# Patient Record
Sex: Male | Born: 1970 | Hispanic: No | Marital: Married | Smoking: Former smoker
Health system: Southern US, Community
[De-identification: ages and names within clinical notes are randomized; demographics above are authoritative.]

## PROBLEM LIST (undated history)

## (undated) DIAGNOSIS — E119 Type 2 diabetes mellitus without complications: Secondary | ICD-10-CM

## (undated) DIAGNOSIS — I10 Essential (primary) hypertension: Secondary | ICD-10-CM

## (undated) DIAGNOSIS — K760 Fatty (change of) liver, not elsewhere classified: Secondary | ICD-10-CM

## (undated) DIAGNOSIS — E785 Hyperlipidemia, unspecified: Secondary | ICD-10-CM

## (undated) DIAGNOSIS — N281 Cyst of kidney, acquired: Secondary | ICD-10-CM

## (undated) DIAGNOSIS — G4730 Sleep apnea, unspecified: Secondary | ICD-10-CM

## (undated) HISTORY — DX: Hyperlipidemia, unspecified: E78.5

## (undated) HISTORY — PX: NO PAST SURGERIES: SHX2092

## (undated) HISTORY — DX: Type 2 diabetes mellitus without complications: E11.9

## (undated) HISTORY — DX: Essential (primary) hypertension: I10

## (undated) HISTORY — DX: Fatty (change of) liver, not elsewhere classified: K76.0

## (undated) HISTORY — DX: Sleep apnea, unspecified: G47.30

---

## 1998-03-23 ENCOUNTER — Encounter: Admission: RE | Admit: 1998-03-23 | Discharge: 1998-03-23 | Payer: Self-pay | Admitting: *Deleted

## 1998-03-27 ENCOUNTER — Emergency Department (HOSPITAL_COMMUNITY): Admission: EM | Admit: 1998-03-27 | Discharge: 1998-03-27 | Payer: Self-pay | Admitting: Emergency Medicine

## 1998-04-03 ENCOUNTER — Encounter: Admission: RE | Admit: 1998-04-03 | Discharge: 1998-04-03 | Payer: Self-pay | Admitting: *Deleted

## 2008-06-23 HISTORY — PX: OTHER SURGICAL HISTORY: SHX169

## 2008-07-04 ENCOUNTER — Encounter: Admission: RE | Admit: 2008-07-04 | Discharge: 2008-07-04 | Payer: Self-pay | Admitting: Internal Medicine

## 2010-05-06 LAB — COMPREHENSIVE METABOLIC PANEL
AST: 42 U/L
Albumin: 5.1
Alkaline Phosphatase: 77 U/L
Sodium: 141 mmol/L (ref 137–147)
Total Protein: 7.6 g/dL

## 2010-05-06 LAB — LIPID PANEL: Triglycerides: 113

## 2010-05-06 LAB — TSH: TSH: 2.52

## 2010-05-06 LAB — CBC
MCV: 81.4 fL
platelet count: 184

## 2010-05-23 DIAGNOSIS — G4730 Sleep apnea, unspecified: Secondary | ICD-10-CM

## 2010-05-23 HISTORY — DX: Sleep apnea, unspecified: G47.30

## 2010-06-14 ENCOUNTER — Encounter: Payer: Self-pay | Admitting: Internal Medicine

## 2011-03-11 ENCOUNTER — Ambulatory Visit: Payer: Self-pay | Admitting: Family Medicine

## 2011-03-28 ENCOUNTER — Encounter: Payer: Self-pay | Admitting: *Deleted

## 2011-03-28 ENCOUNTER — Encounter: Payer: Self-pay | Admitting: Family Medicine

## 2011-03-28 ENCOUNTER — Ambulatory Visit (INDEPENDENT_AMBULATORY_CARE_PROVIDER_SITE_OTHER): Payer: Self-pay | Admitting: Family Medicine

## 2011-03-28 VITALS — BP 128/78 | HR 76 | Temp 98.3°F | Ht 70.0 in | Wt 216.4 lb

## 2011-03-28 DIAGNOSIS — J329 Chronic sinusitis, unspecified: Secondary | ICD-10-CM

## 2011-03-28 DIAGNOSIS — Z0000 Encounter for general adult medical examination without abnormal findings: Secondary | ICD-10-CM

## 2011-03-28 LAB — COMPREHENSIVE METABOLIC PANEL
ALT: 48 U/L (ref 0–53)
Alkaline Phosphatase: 77 U/L (ref 39–117)
Creatinine, Ser: 1.3 mg/dL (ref 0.4–1.5)
Sodium: 141 mEq/L (ref 135–145)
Total Bilirubin: 1.8 mg/dL — ABNORMAL HIGH (ref 0.3–1.2)
Total Protein: 8.1 g/dL (ref 6.0–8.3)

## 2011-03-28 LAB — LIPID PANEL
HDL: 45.4 mg/dL (ref >39.00)
Total CHOL/HDL Ratio: 5
Triglycerides: 142 mg/dL (ref 0.0–149.0)
VLDL: 28.4 mg/dL (ref 0.0–40.0)

## 2011-03-28 MED ORDER — KETOROLAC TROMETHAMINE 60 MG/2ML IM SOLN
60.0000 mg | Freq: Once | INTRAMUSCULAR | Status: AC
  Administered 2011-03-28: 60 mg via INTRAMUSCULAR

## 2011-03-28 MED ORDER — AMOXICILLIN-POT CLAVULANATE 875-125 MG PO TABS: 1.0000 | ORAL_TABLET | Freq: Two times a day (BID) | ORAL | Status: AC

## 2011-03-28 NOTE — Patient Instructions (Addendum)
blood work today. We will defer flu shot. Shot of toradol for pain today.  Then starting tomorrow may take ibuprofen 400-600mg  2-3x/day. I do think you have a sinus infection. Take medicine as prescribed: augmentin twice daily for 10 days. Push fluids and plenty of rest. Nasal saline irrigation or neti pot to help drain sinuses. May use simple mucinex with plenty of fluid to help mobilize mucous. Let us know if fever >101.5, trouble opening/closing mouth, difficulty swallowing, or worsening cough, breathing - you may need to be seen again.

## 2011-03-28 NOTE — Progress Notes (Signed)
Addended by: Josph Macho A on: 03/28/2011 10:30 AM   Modules accepted: Orders

## 2011-03-28 NOTE — Assessment & Plan Note (Signed)
R sinus headache with sinusitis by story. Given presentation, treat with augmentin x 10 days. rec nasal saline, ibuprofen, and mucinex at home. Red flags to return discussed including worsening headache or changing symptoms. Treat headache today with shot of toradol IM.  Denies h/o kidney insufficiency

## 2011-03-28 NOTE — Assessment & Plan Note (Signed)
reviewed preventative protocols and updated. Hold flu as feeling ill. Per pt utd tetanus 2010, will request records from prior PCP. Fasting blood work today.

## 2011-03-28 NOTE — Progress Notes (Signed)
Subjective:    Patient ID: Michael Mcconnell, male    DOB: October 24, 1970, 40 y.o.   MRN: [id number]  HPI CC: new pt establish  would like CPE today.  Woke up yesterday with sinus congestion, bad headache.  + ST.  Blowing nose but nothing coming out, very congested and ears feel stopped up.  Headache travels down to right side of neck - pressure, throbbing.  Mild cough.  Nauseated this morning.  Headache present since last night.  Worse when laying down, pressure in sinuses.  Mild photo/phonophobia.  Hasn't tried anything for this headache.  + tooth pain on right.  Worse with bending head forward.  No fevers/chills, abd pain, v/d, rashes, myalgia, arthralgia.  No vision changes, dizziness.  No paresthesias.    H/o seasonal allergies. H/o OSA, uses CPAP intermittently. No h/o kidney problems.  Preventative: Tetanus at [place] at [facility] 2010. No flu shot this year yet. Last CPE was 1 year ago at [facility] (Dr. Estevan Oaks). Seatbelt 100% time. Sunscreen use - no.  Medications and allergies reviewed and updated in chart.  Past histories reviewed and updated if relevant as below. There is no problem list on file for this patient.  Past Medical History  Diagnosis Date  . Elevated blood pressure reading without diagnosis of hypertension    Past Surgical History  Procedure Date  . No past surgeries    History  Substance Use Topics  . Smoking status: Former Smoker    Quit date: 05/23/2006  . Smokeless tobacco: Never Used   Comment: Socially  . Alcohol Use: Yes     Social   Family History  Problem Relation Age of Onset  . Hypertension Mother   . Coronary artery disease Father 55  . Hypertension Father   . Hyperlipidemia Father   . Stroke Maternal Aunt   . Diabetes Maternal Grandfather   . Stroke Maternal Grandfather   . Diabetes Paternal Grandmother   . Stroke Paternal Grandmother   . Cancer Neg Hx    Allergies  Allergen Reactions  . Aspirin Nausea Only   No current  outpatient prescriptions on file prior to visit.   Review of Systems  Constitutional: Negative for fever, chills, activity change, appetite change, fatigue and unexpected weight change.  HENT: Negative for hearing loss and neck pain.   Eyes: Negative for visual disturbance.  Respiratory: Positive for cough. Negative for chest tightness, shortness of breath and wheezing.   Cardiovascular: Negative for chest pain, palpitations and leg swelling.  Gastrointestinal: Negative for nausea, vomiting, abdominal pain, diarrhea, constipation, blood in stool and abdominal distention.  Genitourinary: Negative for hematuria and difficulty urinating.  Musculoskeletal: Negative for myalgias and arthralgias.  Skin: Negative for rash.  Neurological: Positive for headaches. Negative for dizziness, seizures and syncope.  Hematological: Does not bruise/bleed easily.  Psychiatric/Behavioral: Negative for dysphoric mood. The patient is not nervous/anxious.        Objective:   Physical Exam  Nursing note and vitals reviewed. Constitutional: He is oriented to person, place, and time. He appears well-developed and well-nourished. No distress.  HENT:  Head: Normocephalic and atraumatic.  Right Ear: Hearing, tympanic membrane, external ear and ear canal normal.  Left Ear: Hearing, tympanic membrane, external ear and ear canal normal.  Nose: Mucosal edema present. No rhinorrhea. Right sinus exhibits no maxillary sinus tenderness and no frontal sinus tenderness. Left sinus exhibits no maxillary sinus tenderness and no frontal sinus tenderness.  Mouth/Throat: Uvula is midline and mucous membranes are normal. Posterior oropharyngeal  edema and posterior oropharyngeal erythema present. No oropharyngeal exudate or tonsillar abscesses.  Eyes: Conjunctivae and EOM are normal. Pupils are equal, round, and reactive to light. No scleral icterus.  Neck: Normal range of motion. Neck supple. No thyromegaly present.    Cardiovascular: Normal rate, regular rhythm, normal heart sounds and intact distal pulses.   No murmur heard. Pulses:      Radial pulses are 2+ on the right side, and 2+ on the left side.  Pulmonary/Chest: Effort normal and breath sounds normal. No respiratory distress. He has no wheezes. He has no rales.  Abdominal: Soft. Bowel sounds are normal. He exhibits no distension and no mass. There is no tenderness. There is no rebound and no guarding.  Musculoskeletal: Normal range of motion.       FROM neck. No midline spine tenderness, no shoulder or trap muscle tenderness  Lymphadenopathy:    He has no cervical adenopathy.  Neurological: He is alert and oriented to person, place, and time. He has normal strength. No cranial nerve deficit or sensory deficit. Coordination and gait normal.       Grip strength intact  Skin: Skin is warm and dry. No rash noted.  Psychiatric: He has a normal mood and affect. His behavior is normal. Judgment and thought content normal.      Assessment & Plan:

## 2011-05-01 ENCOUNTER — Encounter: Payer: Self-pay | Admitting: Family Medicine

## 2011-05-04 ENCOUNTER — Encounter: Payer: Self-pay | Admitting: Family Medicine

## 2011-06-15 ENCOUNTER — Telehealth: Payer: Self-pay | Admitting: Family Medicine

## 2011-06-15 DIAGNOSIS — R739 Hyperglycemia, unspecified: Secondary | ICD-10-CM

## 2011-06-15 NOTE — Telephone Encounter (Signed)
Pt has a physical scheduled. Need order for physical  labs mailed to pts home. Labs are done free for [facility] employees.

## 2011-06-16 DIAGNOSIS — E1169 Type 2 diabetes mellitus with other specified complication: Secondary | ICD-10-CM | POA: Insufficient documentation

## 2011-06-16 DIAGNOSIS — E1165 Type 2 diabetes mellitus with hyperglycemia: Secondary | ICD-10-CM | POA: Insufficient documentation

## 2011-06-16 NOTE — Telephone Encounter (Signed)
Wrote script for labs:  BMP, A1c indication hyperglycemia. Placed in [facility]im[facility] box.

## 2011-06-16 NOTE — Telephone Encounter (Signed)
Mailed script as directed.

## 2011-06-20 ENCOUNTER — Other Ambulatory Visit: Payer: Self-pay

## 2011-06-27 ENCOUNTER — Encounter: Payer: Self-pay | Admitting: Family Medicine

## 2011-06-27 ENCOUNTER — Ambulatory Visit (INDEPENDENT_AMBULATORY_CARE_PROVIDER_SITE_OTHER): Payer: BC Managed Care – PPO | Admitting: Family Medicine

## 2011-06-27 ENCOUNTER — Ambulatory Visit: Payer: Self-pay | Admitting: Family Medicine

## 2011-06-27 VITALS — BP 146/102 | HR 80 | Temp 99.3°F | Wt 218.2 lb

## 2011-06-27 DIAGNOSIS — I10 Essential (primary) hypertension: Secondary | ICD-10-CM

## 2011-06-27 DIAGNOSIS — E785 Hyperlipidemia, unspecified: Secondary | ICD-10-CM | POA: Insufficient documentation

## 2011-06-27 DIAGNOSIS — R7309 Other abnormal glucose: Secondary | ICD-10-CM

## 2011-06-27 DIAGNOSIS — E1169 Type 2 diabetes mellitus with other specified complication: Secondary | ICD-10-CM | POA: Insufficient documentation

## 2011-06-27 LAB — BASIC METABOLIC PANEL
BUN: 13 mg/dL (ref 4–21)
Chloride: 105 mmol/L
Creat: 1.15
Potassium: 4.4 mmol/L

## 2011-06-27 MED ORDER — HYDROCHLOROTHIAZIDE 12.5 MG PO TABS: 12.5000 mg | ORAL_TABLET | Freq: Every day | ORAL | Status: DC

## 2011-06-27 NOTE — Assessment & Plan Note (Signed)
Discussed mild hyperlipidemia on latest blood work. Goal LDL likely <100 given fm hx  Discussed dietary modifications to lower LDL.

## 2011-06-27 NOTE — Patient Instructions (Addendum)
Restart hydrochlorothiazide at 12.5mg  daily as your blood pressure is staying elevated. Keep track of blood pressure at home. Work on diet and exercise as weight loss will help both blood pressure and sugar. We will call you with results of today's blood work. Return in 2 months for follow up.  Good to see you today. For cholesterol - More soy, nuts, beans, barley, lentils, oats and plant sterol ester enriched margarine instead of butter.

## 2011-06-27 NOTE — Assessment & Plan Note (Signed)
Glu elevated last visit - rechecked today but no results available yet. Will call him with results.

## 2011-06-27 NOTE — Assessment & Plan Note (Addendum)
Elevated today, and on recheck.  H/o HTN but prior visit good. Restart low dose HCTZ, return 2 mo for f/u. Advised to keep eye on bp at home (has cuff). Baseline EKG today - NSR rate 98, normal axis, intervals, no hypertrophy or acute ST/T changes.

## 2011-06-27 NOTE — Progress Notes (Signed)
  Subjective:    Patient ID: Michael Mcconnell, male    DOB: 1971/01/24, 41 y.o.   MRN: [id number]  HPI CC: review blood work  Pleasant 41 yo with h/o NAFLD and OSA on CPAP presents for blood work f/u but only had labs drawn at [facility] this morning - no results available yet.  I will call him with results.  fmhx CAD premature (father).  HTN - prior on meds, off for last 7 months.  Prior on HCTZ.  No HA, vision changes, CP/tightness, SOB, leg swelling.  Medications and allergies reviewed and updated in chart.  Past histories reviewed and updated if relevant as below. Patient Active Problem List  Diagnoses  . Healthcare maintenance  . Sinusitis  . Hyperglycemia   Past Medical History  Diagnosis Date  . Sleep apnea 2012    CPAP 4 hours/night  . NAFLD (nonalcoholic fatty liver disease) 4098    abd [place], viral hep panel neg  . History of hypertension     off meds  . History of hyperlipidemia     off meds   Past Surgical History  Procedure Date  . No past surgeries   . Abd [place] 06/2008    echogenic, ?fatty infiltration, ?L renal focus interpolar region   History  Substance Use Topics  . Smoking status: Former Smoker    Quit date: 05/23/2006  . Smokeless tobacco: Never Used   Comment: Socially  . Alcohol Use: Yes     Social   Family History  Problem Relation Age of Onset  . Hypertension Mother   . Coronary artery disease Father 30    MI x2  . Hypertension Father   . Hyperlipidemia Father   . Alcohol abuse Father   . Stroke Maternal Aunt   . Diabetes Maternal Grandfather   . Stroke Maternal Grandfather   . Diabetes Paternal Grandmother   . Stroke Paternal Grandmother   . Cancer Neg Hx    Allergies  Allergen Reactions  . Aspirin Nausea Only   No current outpatient prescriptions on file prior to visit.   Review of Systems Per HPI    Objective:   Physical Exam  Nursing note and vitals reviewed. Constitutional: He appears well-developed and well-nourished. No  distress.  HENT:  Head: Normocephalic and atraumatic.  Mouth/Throat: Oropharynx is clear and moist. No oropharyngeal exudate.  Eyes: Conjunctivae and EOM are normal. Pupils are equal, round, and reactive to light. No scleral icterus.  Neck: Normal range of motion. Neck supple. Carotid bruit is not present. No thyromegaly present.  Cardiovascular: Normal rate, regular rhythm, normal heart sounds and intact distal pulses.   No murmur heard. Pulmonary/Chest: Effort normal and breath sounds normal. No respiratory distress. He has no wheezes. He has no rales.  Musculoskeletal: He exhibits no edema.  Lymphadenopathy:    He has no cervical adenopathy.  Skin: Skin is warm and dry. No rash noted.      Assessment & Plan:

## 2011-06-28 ENCOUNTER — Encounter: Payer: Self-pay | Admitting: Family Medicine

## 2011-07-04 ENCOUNTER — Encounter: Payer: Self-pay | Admitting: Family Medicine

## 2011-07-06 ENCOUNTER — Encounter: Payer: Self-pay | Admitting: *Deleted

## 2011-07-08 ENCOUNTER — Telehealth: Payer: Self-pay | Admitting: Family Medicine

## 2011-07-08 NOTE — Telephone Encounter (Signed)
To: [facility] (Daytime Triage) Fax: [phone] From: [facility] Date/ Time: 07/08/2011 4:54 PM Taken By: Crissie Figures, CSR Caller: Zella Ball Facility: not collected Patient: Tayshawn, Purnell DOB: 09-03-1970 Phone: [phone] Reason for Call: Husband received call from the office the message was to call back. Wife calling to see who called . He did have labs done last week?

## 2011-07-11 ENCOUNTER — Telehealth: Payer: Self-pay | Admitting: Family Medicine

## 2011-07-11 NOTE — Telephone Encounter (Signed)
Message left for patient to return my call.  

## 2011-07-11 NOTE — Telephone Encounter (Signed)
[address] [place], [place] [address] p. [phone] f. [phone] To: [facility] (Daytime Triage) Fax: [phone] From: [facility] Date/ Time: 07/08/2011 4:54 PM Taken By: Crissie Figures, CSR Caller: Zella Ball Facility: not collected Patient: Michael Mcconnell, Michael Mcconnell DOB: [birth year] Phone: [phone] Reason for Call: Husband received call from the office the message was to call back. Wife calling to see who called . He did have labs done last week? Regarding Appointment: Appt Date: Appt Time: Unknown Provider: Reason: Details: Outcome:

## 2011-09-26 ENCOUNTER — Ambulatory Visit: Payer: Self-pay | Admitting: Family Medicine

## 2011-09-26 DIAGNOSIS — Z0289 Encounter for other administrative examinations: Secondary | ICD-10-CM

## 2011-10-11 ENCOUNTER — Telehealth: Payer: Self-pay | Admitting: Family Medicine

## 2011-10-11 ENCOUNTER — Encounter: Payer: Self-pay | Admitting: Family Medicine

## 2011-10-11 ENCOUNTER — Ambulatory Visit (INDEPENDENT_AMBULATORY_CARE_PROVIDER_SITE_OTHER): Payer: BC Managed Care – PPO | Admitting: Family Medicine

## 2011-10-11 VITALS — BP 130/90 | HR 102 | Temp 98.7°F | Ht 70.0 in | Wt 209.2 lb

## 2011-10-11 DIAGNOSIS — J069 Acute upper respiratory infection, unspecified: Secondary | ICD-10-CM

## 2011-10-11 MED ORDER — GUAIFENESIN-CODEINE 100-10 MG/5ML PO SYRP: 5.0000 mL | ORAL_SOLUTION | Freq: Four times a day (QID) | ORAL | Status: AC | PRN

## 2011-10-11 NOTE — Assessment & Plan Note (Signed)
With reassuring exam  Disc symptomatic care - see instructions on AVS  Px robitussin with codeine for pm use / cough  Will call if worse/ sinus or ear pain / cough Update if not starting to improve in a week or if worsening

## 2011-10-11 NOTE — Progress Notes (Signed)
Subjective:    Patient ID: Michael Mcconnell, male    DOB: [birth year], 41 y.o.   MRN: [id number]  HPI Started getting uri symptoms sat night - ST and cough and sneezing  Wondered about allergies  Sunday got worse- woke up with severe congestion - later that night -ran a temp - 100.4  achey and chilled Last night 101.4  No fever today  Yellow mucous drainage Also from chest - cough No nvd  Non smoker  No asthma   Taking mucinex and ibuprofen otc  Also generic sudafed   Patient Active Problem List  Diagnoses  . Healthcare maintenance  . Sinusitis  . Hyperglycemia  . Hypertension  . HLD (hyperlipidemia)  . Viral URI with cough   Past Medical History  Diagnosis Date  . Sleep apnea 2012    CPAP 4 hours/night  . NAFLD (nonalcoholic fatty liver disease) 9629    abd [place], viral hep panel neg  . History of hypertension     off meds  . History of hyperlipidemia     off meds   Past Surgical History  Procedure Date  . No past surgeries   . Abd [place] 06/2008    echogenic, ?fatty infiltration, ?L renal focus interpolar region   History  Substance Use Topics  . Smoking status: Former Smoker    Quit date: 05/23/2006  . Smokeless tobacco: Never Used   Comment: Socially  . Alcohol Use: Yes     Social   Family History  Problem Relation Age of Onset  . Hypertension Mother   . Coronary artery disease Father 60    MI x2  . Hypertension Father   . Hyperlipidemia Father   . Alcohol abuse Father   . Stroke Maternal Aunt   . Diabetes Maternal Grandfather   . Stroke Maternal Grandfather   . Diabetes Paternal Grandmother   . Stroke Paternal Grandmother   . Cancer Neg Hx    Allergies  Allergen Reactions  . Aspirin Nausea Only   Current Outpatient Prescriptions on File Prior to Visit  Medication Sig Dispense Refill  . hydrochlorothiazide (HYDRODIURIL) 12.5 MG tablet Take 1 tablet (12.5 mg total) by mouth daily.  90 tablet  3  . Multiple Vitamins-Minerals (MULTIVITAMIN PO) Take  1 capsule by mouth daily.            Review of Systems Review of Systems  Constitutional: Negative for , appetite change, and unexpected weight change.  Eyes: Negative for pain and visual disturbance.  ENT pos for congestion / post nasal drip and ST  Respiratory: Negative for sob or wheeze  Cardiovascular: Negative for cp or palpitations    Gastrointestinal: Negative for nausea, diarrhea and constipation.  Genitourinary: Negative for urgency and frequency.  Skin: Negative for pallor or rash   Neurological: Negative for weakness, light-headedness, numbness and headaches.  Hematological: Negative for adenopathy. Does not bruise/bleed easily.  Psychiatric/Behavioral: Negative for dysphoric mood. The patient is not nervous/anxious.          Objective:   Physical Exam  Constitutional: He appears well-developed and well-nourished. No distress.  HENT:  Head: Normocephalic and atraumatic.  Right Ear: External ear normal.  Mouth/Throat: Oropharynx is clear and moist. No oropharyngeal exudate.       Nares are injected and congested   No sinus tenderness R TM effusion L TM clear  Throat- clear post nasal drip  Eyes: Conjunctivae and EOM are normal. Pupils are equal, round, and reactive to light. Right eye exhibits  no discharge. Left eye exhibits no discharge.  Neck: Normal range of motion. Neck supple. No JVD present. No thyromegaly present.  Cardiovascular: Normal rate, regular rhythm and normal heart sounds.   Pulmonary/Chest: Effort normal and breath sounds normal. No respiratory distress. He has no wheezes. He has no rales. He exhibits no tenderness.  Lymphadenopathy:    He has no cervical adenopathy.  Neurological: He is alert.  Skin: Skin is warm. No rash noted.  Psychiatric: He has a normal mood and affect.          Assessment & Plan:

## 2011-10-11 NOTE — Telephone Encounter (Signed)
Thanks & noted

## 2011-10-11 NOTE — Telephone Encounter (Signed)
Caller: Carmello/Patient; PCP: Eustaquio Boyden; CB#: [phone]; Call regarding Congestion/ Sinus pressure- onset 10/08/11 with Occasional cough; Fever onset 10/09/11 and temp= 101.4 oral last nigh-10/10/11. Triage and Care advice per Cough Protocol and appnt scheduled for 1230 10/11/11 with Dr. Milinda Antis.

## 2011-10-11 NOTE — Patient Instructions (Signed)
I think you have a bad head and chest cold Tylenol or ibuprofen (with food) for fever and aches  Lots of rest and fluids  Nasal saline spray for congestion (use caution if you take sudafed for blood pressure)  mucinex DM - day Try the robitussin with codeine at night  Update if not starting to improve in a week or if worsening

## 2012-01-13 ENCOUNTER — Encounter: Payer: Self-pay | Admitting: Family Medicine

## 2012-01-13 ENCOUNTER — Ambulatory Visit (INDEPENDENT_AMBULATORY_CARE_PROVIDER_SITE_OTHER): Payer: BC Managed Care – PPO | Admitting: Family Medicine

## 2012-01-13 VITALS — BP 132/94 | HR 76 | Temp 98.1°F | Wt 205.5 lb

## 2012-01-13 DIAGNOSIS — K6289 Other specified diseases of anus and rectum: Secondary | ICD-10-CM

## 2012-01-13 DIAGNOSIS — K629 Disease of anus and rectum, unspecified: Secondary | ICD-10-CM

## 2012-01-13 MED ORDER — HYDROCORTISONE 2.5 % RE CREA: TOPICAL_CREAM | Freq: Two times a day (BID) | RECTAL | Status: AC

## 2012-01-13 NOTE — Progress Notes (Signed)
  Subjective:    Patient ID: MONTA POLICE, male    DOB: [birth year], 41 y.o.   MRN: [id number]  HPI CC: cyst on bottom  1.5 wks ago when using bathroom noted itching irritation when wiping.  Noticed lump on edge, size of pea.  Seems to be getting harder.  Not tender.    Has tried to sit in warm water but never came to a head.  Hasn't bled or drained.  No h/o hemorrhoids.  Past Medical History  Diagnosis Date  . Sleep apnea 2012    CPAP 4 hours/night  . NAFLD (nonalcoholic fatty liver disease) 4098    abd [place], viral hep panel neg  . History of hypertension     off meds  . History of hyperlipidemia     off meds     Review of Systems Per HPI    Objective:   Physical Exam  Nursing note and vitals reviewed. Constitutional: He appears well-developed and well-nourished. No distress.  Genitourinary: Rectal exam shows no external hemorrhoid.          Left lateral anus distal to rim with about 1cm firm subcutaneous mass that is mobile and not attached to skin, no fluctuance or tenderness or erythema present.  Pruritic.  Skin intact.       Assessment & Plan:

## 2012-01-13 NOTE — Assessment & Plan Note (Signed)
Subcutaneous perirectal nodule. Given rapidity of presentation, doubt tumor or other malignant etiology. Not quite consistent with hemorrhoid or abscess/boil. ?perianal cyst (noninfected currently). Treat for now with warm compresses as well as hydrocortisone cream.  If not improved, refer to surgery given location for further eval.

## 2012-01-13 NOTE — Patient Instructions (Signed)
This could be hardened hemorrhoid or cyst in anal area.  If that is the case, should improve with time.  Continue warm water soaks and try anusol hc at that area. If not improving in 1 week, let me know for referral to surgery. If any worsening or growing, let me know for sooner referral to surgery.

## 2012-01-19 ENCOUNTER — Telehealth: Payer: Self-pay | Admitting: Family Medicine

## 2012-01-19 NOTE — Telephone Encounter (Signed)
I tried calling patient. Home machine full and wouldn't accept message. Cell phone would ring a "fast busy" on numerous attempts. Will try again later to cancel appt.

## 2012-01-19 NOTE — Telephone Encounter (Signed)
Let's call pt - doesn't need OV with me. Any improvement in nodule?  If any worsening, or not improved at all, will recommend set up for surgery clinic - may refer without appt.

## 2012-01-20 ENCOUNTER — Ambulatory Visit (INDEPENDENT_AMBULATORY_CARE_PROVIDER_SITE_OTHER): Payer: BC Managed Care – PPO | Admitting: Family Medicine

## 2012-01-20 ENCOUNTER — Ambulatory Visit (INDEPENDENT_AMBULATORY_CARE_PROVIDER_SITE_OTHER): Payer: BC Managed Care – PPO | Admitting: Surgery

## 2012-01-20 ENCOUNTER — Encounter (INDEPENDENT_AMBULATORY_CARE_PROVIDER_SITE_OTHER): Payer: Self-pay | Admitting: Surgery

## 2012-01-20 VITALS — BP 130/78 | HR 72 | Temp 97.2°F | Resp 16 | Ht 71.0 in | Wt 205.0 lb

## 2012-01-20 DIAGNOSIS — K645 Perianal venous thrombosis: Secondary | ICD-10-CM

## 2012-01-20 DIAGNOSIS — K6289 Other specified diseases of anus and rectum: Secondary | ICD-10-CM

## 2012-01-20 DIAGNOSIS — K648 Other hemorrhoids: Secondary | ICD-10-CM | POA: Insufficient documentation

## 2012-01-20 NOTE — Progress Notes (Signed)
Subjective:     Patient ID: Michael Mcconnell, male   DOB: [birth year], 41 y.o.   MRN: [id number]  HPI This gentleman is referred by Dr. Sharen Hones for a perianal nodule. It is been present approximately a month. He has been on hydrocortisone cream but it has not decreased in size. He denies pain or drainage. He has no previous history of thrombosed hemorrhoids or perianal abscesses.  Review of Systems     Objective:   Physical Exam On exam, he is comfortable in appearance. His lungs are clear. There is a small 1 cm nodule at approximately the 3:00 position with the patient in decubitus position. After discussing with the patient, I prepped there the Betadine, anesthetized with lidocaine, and used a 16-gauge needle to drain a thrombosed hemorrhoid.    Assessment:     Thrombosed external hemorrhoid    Plan:     I will see him back as needed unless this area recurs.

## 2012-01-20 NOTE — Telephone Encounter (Signed)
Spoke with patients wife and cancelled appt. No improvement and wants referral. Advised to expect call from [facility].

## 2012-01-20 NOTE — Progress Notes (Signed)
See phone note. placed order in chart.

## 2012-05-04 ENCOUNTER — Other Ambulatory Visit: Payer: Self-pay | Admitting: Family Medicine

## 2012-05-04 DIAGNOSIS — R739 Hyperglycemia, unspecified: Secondary | ICD-10-CM

## 2012-05-04 DIAGNOSIS — I10 Essential (primary) hypertension: Secondary | ICD-10-CM

## 2012-05-04 DIAGNOSIS — E785 Hyperlipidemia, unspecified: Secondary | ICD-10-CM

## 2012-05-08 ENCOUNTER — Other Ambulatory Visit: Payer: Self-pay

## 2012-05-15 ENCOUNTER — Encounter: Payer: Self-pay | Admitting: Family Medicine

## 2013-01-17 ENCOUNTER — Other Ambulatory Visit (INDEPENDENT_AMBULATORY_CARE_PROVIDER_SITE_OTHER): Payer: BC Managed Care – PPO

## 2013-01-17 DIAGNOSIS — R7309 Other abnormal glucose: Secondary | ICD-10-CM

## 2013-01-17 DIAGNOSIS — Z0000 Encounter for general adult medical examination without abnormal findings: Secondary | ICD-10-CM

## 2013-01-17 DIAGNOSIS — E785 Hyperlipidemia, unspecified: Secondary | ICD-10-CM

## 2013-01-17 DIAGNOSIS — I10 Essential (primary) hypertension: Secondary | ICD-10-CM

## 2013-01-17 DIAGNOSIS — R739 Hyperglycemia, unspecified: Secondary | ICD-10-CM

## 2013-01-17 LAB — LIPID PANEL
Total CHOL/HDL Ratio: 7
VLDL: 91.6 mg/dL — ABNORMAL HIGH (ref 0.0–40.0)

## 2013-01-17 LAB — BASIC METABOLIC PANEL
Chloride: 103 mEq/L (ref 96–112)
Potassium: 4.5 mEq/L (ref 3.5–5.1)
Sodium: 141 mEq/L (ref 135–145)

## 2013-01-17 LAB — HEMOGLOBIN A1C: Hgb A1c MFr Bld: 6.9 % — ABNORMAL HIGH (ref 4.6–6.5)

## 2013-01-18 LAB — LDL CHOLESTEROL, DIRECT: Direct LDL: 142.1 mg/dL

## 2013-01-24 ENCOUNTER — Ambulatory Visit (INDEPENDENT_AMBULATORY_CARE_PROVIDER_SITE_OTHER): Payer: BC Managed Care – PPO | Admitting: Family Medicine

## 2013-01-24 ENCOUNTER — Encounter: Payer: Self-pay | Admitting: Family Medicine

## 2013-01-24 VITALS — BP 140/94 | HR 88 | Temp 98.4°F | Ht 70.0 in | Wt 226.2 lb

## 2013-01-24 DIAGNOSIS — K649 Unspecified hemorrhoids: Secondary | ICD-10-CM

## 2013-01-24 DIAGNOSIS — Z0000 Encounter for general adult medical examination without abnormal findings: Secondary | ICD-10-CM

## 2013-01-24 DIAGNOSIS — E781 Pure hyperglyceridemia: Secondary | ICD-10-CM

## 2013-01-24 DIAGNOSIS — I10 Essential (primary) hypertension: Secondary | ICD-10-CM

## 2013-01-24 DIAGNOSIS — E119 Type 2 diabetes mellitus without complications: Secondary | ICD-10-CM

## 2013-01-24 DIAGNOSIS — E785 Hyperlipidemia, unspecified: Secondary | ICD-10-CM

## 2013-01-24 DIAGNOSIS — Z23 Encounter for immunization: Secondary | ICD-10-CM

## 2013-01-24 MED ORDER — HYDROCHLOROTHIAZIDE 25 MG PO TABS: 25.0000 mg | ORAL_TABLET | Freq: Every day | ORAL | Status: DC

## 2013-01-24 MED ORDER — HYDROCORTISONE ACETATE 25 MG RE SUPP: 25.0000 mg | Freq: Two times a day (BID) | RECTAL | Status: DC

## 2013-01-24 MED ORDER — AMLODIPINE BESYLATE 5 MG PO TABS: 5.0000 mg | ORAL_TABLET | Freq: Every day | ORAL | Status: DC

## 2013-01-24 MED ORDER — LISINOPRIL-HYDROCHLOROTHIAZIDE 20-25 MG PO TABS: 1.0000 | ORAL_TABLET | Freq: Every day | ORAL | Status: DC

## 2013-01-24 NOTE — Progress Notes (Signed)
Subjective:    Patient ID: Michael Mcconnell, male    DOB: Apr 13, 1971, 42 y.o.   MRN: [id number]  HPI CC: CPE  HTN - recent HA with elevated blood pressure - led to evaluation at [facility] and ACEI was added to hctz component.  No vision changes, CP/tightness, SOB, leg swelling.   Dyslipidemia - h/o this in past.    New dx DM.  Wt Readings from Last 3 Encounters:  01/24/13 226 lb 4 oz (102.626 kg)  01/20/12 205 lb (92.987 kg)  01/13/12 205 lb 8 oz (93.214 kg)    Recent perianal nodule - treated with draining by surgery clinic.  Notices hemorrhoids since then.  Occasional bleeding from hemorrhoids.  Has one soft stool per day.  OTC suppositories haven't helped  Stressed with school for IT, working full time as well.  Preventative:  Td 2009 Flu shot today.  Seatbelt 100% time.   Caffeine: 1-2 cups/day Lives with step daughter, wife, 2 sons, 2 dogs Occupation: Merchandiser, retail for [employer] Activity: no regular exercise Diet: good water, not great fruit, good vegetables, red meat 3x/wk, fish 2x/wk, fast food 3x/wk  Medications and allergies reviewed and updated in chart.  Past histories reviewed and updated if relevant as below. Patient Active Problem List   Diagnosis Date Noted  . Thrombosed external hemorrhoid 01/20/2012  . Hypertension 06/27/2011  . HLD (hyperlipidemia) 06/27/2011  . Prediabetes 06/16/2011  . Healthcare maintenance 03/28/2011  . Sinusitis 03/28/2011   Past Medical History  Diagnosis Date  . Sleep apnea 2012    CPAP 4 hours/night  . NAFLD (nonalcoholic fatty liver disease) 5284    abd [place], viral hep panel neg  . History of hypertension     off meds  . History of hyperlipidemia     off meds   Past Surgical History  Procedure Laterality Date  . No past surgeries    . Abd [place]  06/2008    echogenic, ?fatty infiltration, ?L renal focus interpolar region   History  Substance Use Topics  . Smoking status: Former Smoker    Quit date:  05/23/2006  . Smokeless tobacco: Never Used     Comment: Socially  . Alcohol Use: No   Family History  Problem Relation Age of Onset  . Hypertension Mother   . Coronary artery disease Father 11    MI x2  . Hypertension Father   . Hyperlipidemia Father   . Alcohol abuse Father   . Stroke Maternal Aunt   . Diabetes Maternal Grandfather   . Stroke Maternal Grandfather   . Diabetes Paternal Grandmother   . Stroke Paternal Grandmother   . Cancer Neg Hx    Allergies  Allergen Reactions  . Aspirin Nausea Only   Current Outpatient Prescriptions on File Prior to Visit  Medication Sig Dispense Refill  . Multiple Vitamins-Minerals (MULTIVITAMIN PO) Take 1 capsule by mouth daily.       No current facility-administered medications on file prior to visit.     Review of Systems  Constitutional: Negative for fever, chills, activity change, appetite change, fatigue and unexpected weight change.  HENT: Negative for hearing loss and neck pain.   Eyes: Negative for visual disturbance.  Respiratory: Negative for cough, chest tightness, shortness of breath and wheezing.   Cardiovascular: Negative for chest pain, palpitations and leg swelling.  Gastrointestinal: Positive for blood in stool (hemorrhoids). Negative for nausea, vomiting, abdominal pain, diarrhea, constipation and abdominal distention.  Genitourinary: Negative for hematuria and difficulty  urinating.  Musculoskeletal: Negative for myalgias and arthralgias.  Skin: Negative for rash.  Neurological: Negative for dizziness, seizures, syncope and headaches.  Hematological: Negative for adenopathy. Does not bruise/bleed easily.  Psychiatric/Behavioral: Negative for dysphoric mood. The patient is not nervous/anxious.        Objective:   Physical Exam  Nursing note and vitals reviewed. Constitutional: He is oriented to person, place, and time. He appears well-developed and well-nourished. No distress.  HENT:  Head: Normocephalic and  atraumatic.  Right Ear: External ear normal.  Left Ear: External ear normal.  Nose: Nose normal.  Mouth/Throat: Oropharynx is clear and moist. No oropharyngeal exudate.  Eyes: Conjunctivae and EOM are normal. Pupils are equal, round, and reactive to light. No scleral icterus.  Neck: Normal range of motion. Neck supple. No thyromegaly present.  Cardiovascular: Normal rate, regular rhythm, normal heart sounds and intact distal pulses.   No murmur heard. Pulses:      Radial pulses are 2+ on the right side, and 2+ on the left side.  Pulmonary/Chest: Effort normal and breath sounds normal. No respiratory distress. He has no wheezes. He has no rales.  Abdominal: Soft. Bowel sounds are normal. He exhibits no distension and no mass. There is no tenderness. There is no rebound and no guarding.  Genitourinary: Rectal exam shows external hemorrhoid (noninflamed posterior ext hemorrhoid).  Musculoskeletal: Normal range of motion. He exhibits no edema.  Lymphadenopathy:    He has no cervical adenopathy.  Neurological: He is alert and oriented to person, place, and time.  CN grossly intact, station and gait intact  Skin: Skin is warm and dry. No rash noted.  Psychiatric: He has a normal mood and affect. His behavior is normal. Judgment and thought content normal.       Assessment & Plan:

## 2013-01-24 NOTE — Assessment & Plan Note (Signed)
Elevated trig today - return fasting to recheck, ?diet related. Likely elevated sugars recently contributing.

## 2013-01-24 NOTE — Assessment & Plan Note (Signed)
Deflated posterior ext hemorrhoid today - doubt causing problems. Describes internal hemorrhoid - treat with anusol suppositories - if persistent, return for further evaluation.

## 2013-01-24 NOTE — Patient Instructions (Addendum)
Flu shot today. Don't fill lisinopril hctz combo pill (as I think it may have affected kidneys some). Instead take hctz 25mg  daily and amlodipine 5mg  daily. Give me an update if blood pressures staying elevated. Return fasting for lab visit cholesterol recheck (at your convenience). Trial of prescription suppositories for hemorrhoids Return in 1 month for sugar and blood pressure follow up. Blood work returned concerning for diabetes - watch added sugars and avoid carbs.

## 2013-01-24 NOTE — Assessment & Plan Note (Signed)
Cr bumped on ACEI - stop and start HCTZ and amlodipine. rtc 1 mo for f/u. Pt agrees with plan.

## 2013-01-24 NOTE — Assessment & Plan Note (Signed)
Preventative protocols reviewed and updated unless pt declined. Discussed healthy diet and lifestyle.  

## 2013-01-24 NOTE — Addendum Note (Signed)
Addended by: Josph Macho A on: 01/24/2013 05:03 PM   Modules accepted: Orders

## 2013-01-24 NOTE — Assessment & Plan Note (Signed)
Briefly reviewed dx today - discussed importance of diet.  Avoid sugars and carbs. rtc 1 mo for f/u.

## 2013-02-18 ENCOUNTER — Other Ambulatory Visit (INDEPENDENT_AMBULATORY_CARE_PROVIDER_SITE_OTHER): Payer: BC Managed Care – PPO

## 2013-02-18 DIAGNOSIS — E781 Pure hyperglyceridemia: Secondary | ICD-10-CM

## 2013-02-19 ENCOUNTER — Other Ambulatory Visit: Payer: Self-pay

## 2013-02-19 ENCOUNTER — Ambulatory Visit: Payer: BC Managed Care – PPO

## 2013-02-19 DIAGNOSIS — E1059 Type 1 diabetes mellitus with other circulatory complications: Secondary | ICD-10-CM

## 2013-02-19 LAB — CREATININE, SERUM: Creatinine, Ser: 1.4 mg/dL (ref 0.4–1.5)

## 2013-02-25 ENCOUNTER — Ambulatory Visit: Payer: Self-pay | Admitting: Family Medicine

## 2013-02-28 ENCOUNTER — Ambulatory Visit: Payer: Self-pay | Admitting: Family Medicine

## 2013-02-28 DIAGNOSIS — Z0289 Encounter for other administrative examinations: Secondary | ICD-10-CM

## 2013-04-08 ENCOUNTER — Emergency Department (HOSPITAL_COMMUNITY)
Admission: EM | Admit: 2013-04-08 | Discharge: 2013-04-08 | Disposition: A | Discharge status: Home / Self Care | Payer: BC Managed Care – PPO | Attending: Emergency Medicine | Admitting: Emergency Medicine

## 2013-04-08 ENCOUNTER — Encounter (HOSPITAL_COMMUNITY): Payer: Self-pay | Admitting: Emergency Medicine

## 2013-04-08 DIAGNOSIS — Z8679 Personal history of other diseases of the circulatory system: Secondary | ICD-10-CM | POA: Insufficient documentation

## 2013-04-08 DIAGNOSIS — G4730 Sleep apnea, unspecified: Secondary | ICD-10-CM | POA: Insufficient documentation

## 2013-04-08 DIAGNOSIS — T304 Corrosion of unspecified body region, unspecified degree: Secondary | ICD-10-CM

## 2013-04-08 DIAGNOSIS — Z862 Personal history of diseases of the blood and blood-forming organs and certain disorders involving the immune mechanism: Secondary | ICD-10-CM | POA: Insufficient documentation

## 2013-04-08 DIAGNOSIS — Z8639 Personal history of other endocrine, nutritional and metabolic disease: Secondary | ICD-10-CM | POA: Insufficient documentation

## 2013-04-08 DIAGNOSIS — Z79899 Other long term (current) drug therapy: Secondary | ICD-10-CM | POA: Insufficient documentation

## 2013-04-08 DIAGNOSIS — Z87891 Personal history of nicotine dependence: Secondary | ICD-10-CM | POA: Insufficient documentation

## 2013-04-08 DIAGNOSIS — Z8719 Personal history of other diseases of the digestive system: Secondary | ICD-10-CM | POA: Insufficient documentation

## 2013-04-08 DIAGNOSIS — Y92410 Unspecified street and highway as the place of occurrence of the external cause: Secondary | ICD-10-CM | POA: Insufficient documentation

## 2013-04-08 DIAGNOSIS — T22199A Burn of first degree of multiple sites of unspecified shoulder and upper limb, except wrist and hand, initial encounter: Secondary | ICD-10-CM | POA: Insufficient documentation

## 2013-04-08 DIAGNOSIS — Z888 Allergy status to other drugs, medicaments and biological substances status: Secondary | ICD-10-CM | POA: Insufficient documentation

## 2013-04-08 DIAGNOSIS — R209 Unspecified disturbances of skin sensation: Secondary | ICD-10-CM | POA: Insufficient documentation

## 2013-04-08 DIAGNOSIS — Y939 Activity, unspecified: Secondary | ICD-10-CM | POA: Insufficient documentation

## 2013-04-08 MED ORDER — BACITRACIN-NEOMYCIN-POLYMYXIN OINTMENT TUBE
TOPICAL_OINTMENT | Freq: Once | CUTANEOUS | Status: AC
  Administered 2013-04-08: 01:00:00 via TOPICAL
  Filled 2013-04-08: qty 15

## 2013-04-08 MED ORDER — IBUPROFEN 200 MG PO TABS
600.0000 mg | ORAL_TABLET | Freq: Once | ORAL | Status: AC
  Administered 2013-04-08: 600 mg via ORAL
  Filled 2013-04-08 (×2): qty 1

## 2013-04-08 MED ORDER — IBUPROFEN 600 MG PO TABS: 600.0000 mg | ORAL_TABLET | Freq: Three times a day (TID) | ORAL | Status: DC | PRN
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
## 2013-04-08 NOTE — ED Provider Notes (Signed)
Medical screening examination/treatment/procedure(s) were performed by non-physician practitioner and as supervising physician I was immediately available for consultation/collaboration.    Dartanyon Frankowski, MD 04/08/13 0700 
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
## 2013-12-06 ENCOUNTER — Encounter: Payer: Self-pay | Admitting: Family Medicine

## 2013-12-06 ENCOUNTER — Ambulatory Visit (INDEPENDENT_AMBULATORY_CARE_PROVIDER_SITE_OTHER): Payer: BC Managed Care – PPO | Admitting: Family Medicine

## 2013-12-06 VITALS — BP 130/88 | HR 76 | Temp 98.5°F | Wt 228.5 lb

## 2013-12-06 DIAGNOSIS — E119 Type 2 diabetes mellitus without complications: Secondary | ICD-10-CM

## 2013-12-06 DIAGNOSIS — I10 Essential (primary) hypertension: Secondary | ICD-10-CM

## 2013-12-06 DIAGNOSIS — E785 Hyperlipidemia, unspecified: Secondary | ICD-10-CM

## 2013-12-06 LAB — MICROALBUMIN / CREATININE URINE RATIO
CREATININE, U: 336.7 mg/dL
MICROALB/CREAT RATIO: 1 mg/g (ref 0.0–30.0)
Microalb, Ur: 3.5 mg/dL — ABNORMAL HIGH (ref 0.0–1.9)

## 2013-12-06 LAB — RENAL FUNCTION PANEL
ALBUMIN: 4.4 g/dL (ref 3.5–5.2)
BUN: 17 mg/dL (ref 6–23)
CHLORIDE: 102 meq/L (ref 96–112)
CO2: 31 meq/L (ref 19–32)
Calcium: 9.3 mg/dL (ref 8.4–10.5)
Creatinine, Ser: 1.3 mg/dL (ref 0.4–1.5)
GFR: 64.62 mL/min (ref >60.00)
Glucose, Bld: 139 mg/dL — ABNORMAL HIGH (ref 70–99)
PHOSPHORUS: 3.1 mg/dL (ref 2.3–4.6)
POTASSIUM: 3.5 meq/L (ref 3.5–5.1)
Sodium: 140 mEq/L (ref 135–145)

## 2013-12-06 LAB — LIPID PANEL
CHOLESTEROL: 221 mg/dL — AB (ref 0–200)
HDL: 36 mg/dL — AB (ref >39.00)
LDL Cholesterol: 145 mg/dL — ABNORMAL HIGH (ref 0–99)
NONHDL: 185
TRIGLYCERIDES: 200 mg/dL — AB (ref 0.0–149.0)
Total CHOL/HDL Ratio: 6
VLDL: 40 mg/dL (ref 0.0–40.0)

## 2013-12-06 LAB — HEMOGLOBIN A1C: Hgb A1c MFr Bld: 7.5 % — ABNORMAL HIGH (ref 4.6–6.5)

## 2013-12-06 MED ORDER — LOSARTAN POTASSIUM 50 MG PO TABS: 50.0000 mg | ORAL_TABLET | Freq: Every day | ORAL | Status: DC

## 2013-12-06 NOTE — Assessment & Plan Note (Addendum)
Chronically elevated in past. Recheck FLP today and ecide on statin based on lab results. Pt agrees. Reviewed goal LDL <100 given personal and fmhx.

## 2013-12-06 NOTE — Patient Instructions (Addendum)
blood work today - we will call you with results and if needed other new medications. Stop amlodipine. Start losartan 50mg  daily (sent to pharmacy). Return 2 weeks after starting losartan for kidney function check (labvisit). Return in 3-4 months for follow up visit.

## 2013-12-06 NOTE — Assessment & Plan Note (Signed)
Elevated readings based on home measures. Today in office better. Will trial ARB - stop norvasc and start losartan 50mg  daily. Continue HCTZ 25mg  daily. ?HCTZ sexual disfunction - consider trial off this in future. For now, provided with viagra samples. Discussed effects to monitor for on this med.  H/o Cr bumping on ACEI. Trial of ARB today - check Cr and rtc 2 wks for lab visit to recheck Cr. RTC 3-4 mo f/u

## 2013-12-06 NOTE — Progress Notes (Signed)
BP 130/88  Pulse 76  Temp(Src) 98.5 F (36.9 C) (Oral)  Wt 228 lb 8 oz (103.647 kg)   CC: DM f/u  Subjective:    Patient ID: Michael Mcconnell, male    DOB: 04/08/1971, 43 y.o.   MRN: [id number]  HPI: Michael Mcconnell is a 43 y.o. 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 Also works full time - stressful. Last 2 years have been stressful for this reason.  Wt Readings from Last 3 Encounters:  12/06/13 228 lb 8 oz (103.647 kg)  04/08/13 233 lb (105.688 kg)  01/24/13 226 lb 4 oz (102.626 kg)  Body mass index is 31.88 kg/(m^2).  New dx diabetes 12/2012 by labwork. Never returned for f/u as advised. Here for f/u today and diabetic bundle. Lab Results  Component Value Date   HGBA1C 6.9* 01/17/2013    T2DM - regularly does not check sugars has wife's glucose meter at home. Currently diet controlled.  Denies low sugars or hypoglycemic symptoms.  Denies paresthesias. Last diabetic eye exam last month.  Pneumovax: not done.  Prevnar: not done. Not compliant with diabetic diet, doesn't have much time for exercise.   HTN - Compliant with current antihypertensive regimen of hctz 25mg  daily and amlodipine 5mg  daily..  Does check blood pressures at home occasionally - running 160/100s. No low blood pressure symptoms of dizziness/syncope.  Denies vision changes, CP/tightness, SOB, leg swelling. + HAs recently. Lisinopril caused worsening headache.  HLD - not on medication for this.   Relevant past medical, surgical, family and social history reviewed and updated as indicated.  Allergies and medications reviewed and updated. Current Outpatient Prescriptions on File Prior to Visit  Medication Sig  . hydrochlorothiazide (HYDRODIURIL) 25 MG tablet Take 1 tablet (25 mg total) by mouth daily.  . hydrocortisone (ANUSOL-HC) 25 MG suppository Place 1 suppository (25 mg total) rectally 2 (two) times daily.  Marland Kitchen ibuprofen (ADVIL,MOTRIN) 600  MG tablet Take 1 tablet (600 mg total) by mouth every 8 (eight) hours as needed.  . Multiple Vitamins-Minerals (MULTIVITAMIN PO) Take 1 capsule by mouth daily.   No current facility-administered medications on file prior to visit.    Review of Systems Per HPI unless specifically indicated above    Objective:    BP 130/88  Pulse 76  Temp(Src) 98.5 F (36.9 C) (Oral)  Wt 228 lb 8 oz (103.647 kg)  Physical Exam  Nursing note and vitals reviewed. Constitutional: He appears well-developed and well-nourished. No distress.  HENT:  Mouth/Throat: Oropharynx is clear and moist. No oropharyngeal exudate.  Cardiovascular: Normal rate, regular rhythm, normal heart sounds and intact distal pulses.   No murmur heard. Pulmonary/Chest: Effort normal and breath sounds normal. No respiratory distress. He has no wheezes. He has no rales.  Musculoskeletal: He exhibits no edema.  Diabetic foot exam: Normal inspection Dry skin  No calluses  Normal DP pulses Normal sensation to light touch and monofilament Nails normal   Results for orders placed in visit on 02/19/13  CREATININE, SERUM      Result Value Ref Range   Creatinine, Ser 1.4  0.4 - 1.5 mg/dL      Assessment & Plan:   Problem List Items Addressed This Visit   Hypertension     Elevated readings based on home measures. Today in office better. Will trial ARB - stop norvasc and start losartan 50mg  daily. Continue HCTZ 25mg  daily. ?HCTZ sexual disfunction -  consider trial off this in future. For now, provided with viagra samples. Discussed effects to monitor for on this med.  H/o Cr bumping on ACEI. Trial of ARB today - check Cr and rtc 2 wks for lab visit to recheck Cr. RTC 3-4 mo f/u    Relevant Medications      losartan (COZAAR) tablet   Other Relevant Orders      Renal function panel   Dyslipidemia     Chronically elevated in past. Recheck FLP today and ecide on statin based on lab results. Pt agrees. Reviewed goal LDL <100  given personal and fmhx.    Relevant Orders      Lipid panel   Diabetes type 2, controlled - Primary     Recheck A1c, check microalb and Cr today. If A1c trending up, start metformin. Discussed with patient. Discussed pathophysiology of type 2 diabetes, discussed currently diet controlled. Discussed yearly eye exams. Foot exam today.    Relevant Medications      losartan (COZAAR) tablet   Other Relevant Orders      HM DIABETES FOOT EXAM (Completed)      Hemoglobin A1c      Renal function panel      Microalbumin / creatinine urine ratio       Follow up plan: Return in about 3 months (around 03/08/2014), or as needed, for follow up visit.

## 2013-12-06 NOTE — Assessment & Plan Note (Signed)
Recheck A1c, check microalb and Cr today. If A1c trending up, start metformin. Discussed with patient. Discussed pathophysiology of type 2 diabetes, discussed currently diet controlled. Discussed yearly eye exams. Foot exam today.

## 2013-12-06 NOTE — Progress Notes (Signed)
Pre visit review using our clinic review tool, if applicable. No additional management support is needed unless otherwise documented below in the visit note. 

## 2013-12-08 ENCOUNTER — Other Ambulatory Visit: Payer: Self-pay | Admitting: Family Medicine

## 2013-12-08 MED ORDER — ATORVASTATIN CALCIUM 40 MG PO TABS: 40.0000 mg | ORAL_TABLET | Freq: Every day | ORAL | Status: DC

## 2013-12-08 MED ORDER — METFORMIN HCL ER 500 MG PO TB24: 1000.0000 mg | ORAL_TABLET | Freq: Every day | ORAL | Status: DC

## 2013-12-09 ENCOUNTER — Encounter: Payer: Self-pay | Admitting: *Deleted

## 2013-12-11 ENCOUNTER — Other Ambulatory Visit: Payer: Self-pay | Admitting: Family Medicine

## 2013-12-11 DIAGNOSIS — Z79899 Other long term (current) drug therapy: Secondary | ICD-10-CM

## 2013-12-20 ENCOUNTER — Other Ambulatory Visit: Payer: BC Managed Care – PPO

## 2014-03-21 ENCOUNTER — Ambulatory Visit: Payer: BC Managed Care – PPO | Admitting: Family Medicine

## 2014-03-21 DIAGNOSIS — Z0289 Encounter for other administrative examinations: Secondary | ICD-10-CM

## 2014-05-02 ENCOUNTER — Other Ambulatory Visit: Payer: Self-pay | Admitting: Family Medicine

## 2014-05-23 HISTORY — PX: HEMORRHOID BANDING: SHX5850

## 2014-06-07 ENCOUNTER — Other Ambulatory Visit: Payer: Self-pay | Admitting: Family Medicine

## 2014-06-07 DIAGNOSIS — E785 Hyperlipidemia, unspecified: Secondary | ICD-10-CM

## 2014-06-07 DIAGNOSIS — E119 Type 2 diabetes mellitus without complications: Secondary | ICD-10-CM

## 2014-06-07 DIAGNOSIS — D751 Secondary polycythemia: Secondary | ICD-10-CM

## 2014-06-07 DIAGNOSIS — I10 Essential (primary) hypertension: Secondary | ICD-10-CM

## 2014-06-09 ENCOUNTER — Other Ambulatory Visit (INDEPENDENT_AMBULATORY_CARE_PROVIDER_SITE_OTHER): Payer: BLUE CROSS/BLUE SHIELD

## 2014-06-09 DIAGNOSIS — D751 Secondary polycythemia: Secondary | ICD-10-CM | POA: Diagnosis not present

## 2014-06-09 DIAGNOSIS — Z79899 Other long term (current) drug therapy: Secondary | ICD-10-CM | POA: Diagnosis not present

## 2014-06-09 DIAGNOSIS — E785 Hyperlipidemia, unspecified: Secondary | ICD-10-CM | POA: Diagnosis not present

## 2014-06-09 DIAGNOSIS — E119 Type 2 diabetes mellitus without complications: Secondary | ICD-10-CM | POA: Diagnosis not present

## 2014-06-09 DIAGNOSIS — I10 Essential (primary) hypertension: Secondary | ICD-10-CM

## 2014-06-09 LAB — COMPREHENSIVE METABOLIC PANEL
ALBUMIN: 4.2 g/dL (ref 3.5–5.2)
ALK PHOS: 83 U/L (ref 39–117)
ALT: 56 U/L — AB (ref 0–53)
AST: 45 U/L — ABNORMAL HIGH (ref 0–37)
BUN: 22 mg/dL (ref 6–23)
CALCIUM: 9.6 mg/dL (ref 8.4–10.5)
CO2: 29 meq/L (ref 19–32)
Chloride: 103 mEq/L (ref 96–112)
Creatinine, Ser: 1.24 mg/dL (ref 0.40–1.50)
GFR: 68.08 mL/min (ref >60.00)
Glucose, Bld: 130 mg/dL — ABNORMAL HIGH (ref 70–99)
Potassium: 3.8 mEq/L (ref 3.5–5.1)
SODIUM: 140 meq/L (ref 135–145)
Total Bilirubin: 1.1 mg/dL (ref 0.2–1.2)
Total Protein: 7 g/dL (ref 6.0–8.3)

## 2014-06-09 LAB — LIPID PANEL
Cholesterol: 110 mg/dL (ref 0–200)
HDL: 30.8 mg/dL — ABNORMAL LOW (ref >39.00)
NONHDL: 79.2
Total CHOL/HDL Ratio: 4
Triglycerides: 240 mg/dL — ABNORMAL HIGH (ref 0.0–149.0)
VLDL: 48 mg/dL — AB (ref 0.0–40.0)

## 2014-06-09 LAB — CBC WITH DIFFERENTIAL/PLATELET
BASOS ABS: 0.1 10*3/uL (ref 0.0–0.1)
Basophils Relative: 0.6 % (ref 0.0–3.0)
EOS ABS: 0.1 10*3/uL (ref 0.0–0.7)
EOS PCT: 1.3 % (ref 0.0–5.0)
HCT: 51 % (ref 39.0–52.0)
Hemoglobin: 16.5 g/dL (ref 13.0–17.0)
Lymphocytes Relative: 34.5 % (ref 12.0–46.0)
Lymphs Abs: 3.8 10*3/uL (ref 0.7–4.0)
MCHC: 32.4 g/dL (ref 30.0–36.0)
MCV: 80.7 fl (ref 78.0–100.0)
MONOS PCT: 5.6 % (ref 3.0–12.0)
Monocytes Absolute: 0.6 10*3/uL (ref 0.1–1.0)
NEUTROS ABS: 6.5 10*3/uL (ref 1.4–7.7)
Neutrophils Relative %: 58 % (ref 43.0–77.0)
Platelets: 176 10*3/uL (ref 150.0–400.0)
RBC: 6.33 Mil/uL — ABNORMAL HIGH (ref 4.22–5.81)
RDW: 14.1 % (ref 11.5–15.5)
WBC: 11.2 10*3/uL — ABNORMAL HIGH (ref 4.0–10.5)

## 2014-06-09 LAB — LDL CHOLESTEROL, DIRECT: Direct LDL: 62 mg/dL

## 2014-06-09 LAB — TSH: TSH: 3.99 u[IU]/mL (ref 0.35–4.50)

## 2014-06-09 LAB — HEMOGLOBIN A1C: Hgb A1c MFr Bld: 7.5 % — ABNORMAL HIGH (ref 4.6–6.5)

## 2014-06-10 ENCOUNTER — Encounter: Payer: Self-pay | Admitting: Family Medicine

## 2014-06-10 ENCOUNTER — Ambulatory Visit (INDEPENDENT_AMBULATORY_CARE_PROVIDER_SITE_OTHER): Payer: BLUE CROSS/BLUE SHIELD | Admitting: Family Medicine

## 2014-06-10 VITALS — BP 128/100 | HR 80 | Temp 98.3°F | Ht 70.0 in | Wt 229.2 lb

## 2014-06-10 DIAGNOSIS — IMO0002 Reserved for concepts with insufficient information to code with codable children: Secondary | ICD-10-CM

## 2014-06-10 DIAGNOSIS — E669 Obesity, unspecified: Secondary | ICD-10-CM | POA: Insufficient documentation

## 2014-06-10 DIAGNOSIS — E1165 Type 2 diabetes mellitus with hyperglycemia: Secondary | ICD-10-CM

## 2014-06-10 DIAGNOSIS — I10 Essential (primary) hypertension: Secondary | ICD-10-CM

## 2014-06-10 DIAGNOSIS — Z0000 Encounter for general adult medical examination without abnormal findings: Secondary | ICD-10-CM

## 2014-06-10 DIAGNOSIS — E785 Hyperlipidemia, unspecified: Secondary | ICD-10-CM

## 2014-06-10 MED ORDER — HYDROCHLOROTHIAZIDE 25 MG PO TABS: ORAL_TABLET | ORAL | Status: DC

## 2014-06-10 MED ORDER — ATORVASTATIN CALCIUM 40 MG PO TABS: 40.0000 mg | ORAL_TABLET | Freq: Every day | ORAL | Status: DC

## 2014-06-10 MED ORDER — LOSARTAN POTASSIUM 50 MG PO TABS
50.0000 mg | ORAL_TABLET | Freq: Every day | ORAL | Status: DC
Start: 2014-06-10 — End: 2015-09-01

## 2014-06-10 MED ORDER — METFORMIN HCL ER 500 MG PO TB24: 1500.0000 mg | ORAL_TABLET | Freq: Every day | ORAL | Status: DC

## 2014-06-10 NOTE — Progress Notes (Signed)
Pre visit review using our clinic review tool, if applicable. No additional management support is needed unless otherwise documented below in the visit note. 

## 2014-06-10 NOTE — Assessment & Plan Note (Signed)
Elevated today, has been out of losartan for several months. meds refilled today.

## 2014-06-10 NOTE — Assessment & Plan Note (Signed)
Chronic, A1c staying elevated. Increase metformin XL to 1500mg  daily. Refer to DM education, advised bring wife. Discussed healthy diet and lifestyle choices.

## 2014-06-10 NOTE — Assessment & Plan Note (Signed)
Body mass index is 32.89 kg/(m^2).  Discussed healthy diet and importance of regular exercise to affect weight loss.

## 2014-06-10 NOTE — Assessment & Plan Note (Signed)
Preventative protocols reviewed and updated unless pt declined. Discussed healthy diet and lifestyle.  

## 2014-06-10 NOTE — Progress Notes (Signed)
BP 128/100 mmHg  Pulse 80  Temp(Src) 98.3 F (36.8 C) (Oral)  Ht 5\' 10"  (1.778 m)  Wt 229 lb 4 oz (103.987 kg)  BMI 32.89 kg/m2   CC: CPE  Subjective:    Patient ID: Michael Mcconnell, male    DOB: 13-Jan-1971, 44 y.o.   MRN: [id number]  HPI: Michael Mcconnell is a 44 y.o. 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   Out of bp meds for last few months. bp at home running elevated as well.  Doesn't regularly check sugars. Compliant with metformin. Doesn't regularly eat lunch.  Lab Results  Component Value Date   HGBA1C 7.5* 06/09/2014    Preventative: Td 2009 Flu shot at work Seatbelt 100% time.   Caffeine: 1-2 cups/day Lives with step daughter, wife, 2 sons, 2 dogs Occupation: Librarian, academic for [employer] Activity: no regular exercise Diet: good water, working on fruits/vegetables.  Relevant past medical, surgical, family and social history reviewed and updated as indicated. Interim medical history since our last visit reviewed. Allergies and medications reviewed and updated. Current Outpatient Prescriptions on File Prior to Visit  Medication Sig  . hydrocortisone (ANUSOL-HC) 25 MG suppository Place 1 suppository (25 mg total) rectally 2 (two) times daily.  Marland Kitchen ibuprofen (ADVIL,MOTRIN) 600 MG tablet Take 1 tablet (600 mg total) by mouth every 8 (eight) hours as needed.  . Multiple Vitamins-Minerals (MULTIVITAMIN PO) Take 1 capsule by mouth daily.   No current facility-administered medications on file prior to visit.    Review of Systems  Constitutional: Negative for fever, chills, activity change, appetite change, fatigue and unexpected weight change.  HENT: Negative for hearing loss.   Eyes: Negative for visual disturbance.  Respiratory: Negative for cough, chest tightness, shortness of breath and wheezing.   Cardiovascular: Negative for chest pain, palpitations and leg swelling.  Gastrointestinal: Negative for  nausea, vomiting, abdominal pain, diarrhea, constipation, blood in stool and abdominal distention.  Genitourinary: Negative for hematuria and difficulty urinating.  Musculoskeletal: Negative for myalgias, arthralgias and neck pain.  Skin: Negative for rash.  Neurological: Negative for dizziness, seizures, syncope and headaches.  Hematological: Negative for adenopathy. Does not bruise/bleed easily.  Psychiatric/Behavioral: Negative for dysphoric mood. The patient is not nervous/anxious.    Per HPI unless specifically indicated above     Objective:    BP 128/100 mmHg  Pulse 80  Temp(Src) 98.3 F (36.8 C) (Oral)  Ht 5\' 10"  (1.778 m)  Wt 229 lb 4 oz (103.987 kg)  BMI 32.89 kg/m2  Wt Readings from Last 3 Encounters:  06/10/14 229 lb 4 oz (103.987 kg)  12/06/13 228 lb 8 oz (103.647 kg)  04/08/13 233 lb (105.688 kg)    Physical Exam  Constitutional: He is oriented to person, place, and time. He appears well-developed and well-nourished. No distress.  HENT:  Head: Normocephalic and atraumatic.  Right Ear: Hearing, tympanic membrane, external ear and ear canal normal.  Left Ear: Hearing, tympanic membrane, external ear and ear canal normal.  Nose: Nose normal.  Mouth/Throat: Uvula is midline, oropharynx is clear and moist and mucous membranes are normal. No oropharyngeal exudate, posterior oropharyngeal edema or posterior oropharyngeal erythema.  Eyes: Conjunctivae and EOM are normal. Pupils are equal, round, and reactive to light. No scleral icterus.  Neck: Normal range of motion. Neck supple. No thyromegaly present.  Cardiovascular: Normal rate, regular rhythm, normal heart sounds and intact distal pulses.   No murmur heard.  Pulses:      Radial pulses are 2+ on the right side, and 2+ on the left side.  Pulmonary/Chest: Effort normal and breath sounds normal. No respiratory distress. He has no wheezes. He has no rales.  Abdominal: Soft. Bowel sounds are normal. He exhibits no  distension and no mass. There is no tenderness. There is no rebound and no guarding.  Musculoskeletal: Normal range of motion. He exhibits no edema.  Lymphadenopathy:    He has no cervical adenopathy.  Neurological: He is alert and oriented to person, place, and time.  CN grossly intact, station and gait intact  Skin: Skin is warm and dry. No rash noted.  Psychiatric: He has a normal mood and affect. His behavior is normal. Judgment and thought content normal.  Nursing note and vitals reviewed.  Results for orders placed or performed in visit on 06/09/14  Lipid panel  Result Value Ref Range   Cholesterol 110 0 - 200 mg/dL   Triglycerides 240.0 (H) 0.0 - 149.0 mg/dL   HDL 30.80 (L) >39.00 mg/dL   VLDL 48.0 (H) 0.0 - 40.0 mg/dL   Total CHOL/HDL Ratio 4    NonHDL 79.20   Comprehensive metabolic panel  Result Value Ref Range   Sodium 140 135 - 145 mEq/L   Potassium 3.8 3.5 - 5.1 mEq/L   Chloride 103 96 - 112 mEq/L   CO2 29 19 - 32 mEq/L   Glucose, Bld 130 (H) 70 - 99 mg/dL   BUN 22 6 - 23 mg/dL   Creatinine, Ser 1.24 0.40 - 1.50 mg/dL   Total Bilirubin 1.1 0.2 - 1.2 mg/dL   Alkaline Phosphatase 83 39 - 117 U/L   AST 45 (H) 0 - 37 U/L   ALT 56 (H) 0 - 53 U/L   Total Protein 7.0 6.0 - 8.3 g/dL   Albumin 4.2 3.5 - 5.2 g/dL   Calcium 9.6 8.4 - 10.5 mg/dL   GFR 68.08 >60.00 mL/min  Hemoglobin A1c  Result Value Ref Range   Hgb A1c MFr Bld 7.5 (H) 4.6 - 6.5 %  CBC with Differential  Result Value Ref Range   WBC 11.2 (H) 4.0 - 10.5 K/uL   RBC 6.33 (H) 4.22 - 5.81 Mil/uL   Hemoglobin 16.5 13.0 - 17.0 g/dL   HCT 51.0 39.0 - 52.0 %   MCV 80.7 78.0 - 100.0 fl   MCHC 32.4 30.0 - 36.0 g/dL   RDW 14.1 11.5 - 15.5 %   Platelets 176.0 150.0 - 400.0 K/uL   Neutrophils Relative % 58.0 43.0 - 77.0 %   Lymphocytes Relative 34.5 12.0 - 46.0 %   Monocytes Relative 5.6 3.0 - 12.0 %   Eosinophils Relative 1.3 0.0 - 5.0 %   Basophils Relative 0.6 0.0 - 3.0 %   Neutro Abs 6.5 1.4 - 7.7 K/uL    Lymphs Abs 3.8 0.7 - 4.0 K/uL   Monocytes Absolute 0.6 0.1 - 1.0 K/uL   Eosinophils Absolute 0.1 0.0 - 0.7 K/uL   Basophils Absolute 0.1 0.0 - 0.1 K/uL  TSH  Result Value Ref Range   TSH 3.99 0.35 - 4.50 uIU/mL  LDL cholesterol, direct  Result Value Ref Range   Direct LDL 62.0 mg/dL      Assessment & Plan:   Problem List Items Addressed This Visit    Obesity    Body mass index is 32.89 kg/(m^2).  Discussed healthy diet and importance of regular exercise to affect weight loss.  Relevant Medications   metFORMIN (GLUCOPHAGE-XR) 24 hr tablet   Hypertension    Elevated today, has been out of losartan for several months. meds refilled today.      Relevant Medications   atorvastatin (LIPITOR) tablet   hydrochlorothiazide tablet   losartan (COZAAR) tablet   Healthcare maintenance - Primary    Preventative protocols reviewed and updated unless pt declined. Discussed healthy diet and lifestyle.       Dyslipidemia    Chronic, improved. Trig remains elevated and HDL remains low. Discussed healthy choices to affect change.      Relevant Medications   atorvastatin (LIPITOR) tablet   Diabetes mellitus type 2, uncontrolled    Chronic, A1c staying elevated. Increase metformin XL to 1500mg  daily. Refer to DM education, advised bring wife. Discussed healthy diet and lifestyle choices.      Relevant Medications   atorvastatin (LIPITOR) tablet   metFORMIN (GLUCOPHAGE-XR) 24 hr tablet   losartan (COZAAR) tablet   Other Relevant Orders   Ambulatory referral to diabetic education       Follow up plan: Return in about 3 months (around 09/09/2014), or as needed, for follow up visit.

## 2014-06-10 NOTE — Patient Instructions (Addendum)
Increase metformin to 1500mg  XL once daily - 3 tablets in the morning Pass by Marion's office for diabetes education class information (scheduling) I've refilled meds. Continue to watch bp at home and let me know if persistently elevated. Return in 3-4 months for diabetes follow up.

## 2014-06-10 NOTE — Assessment & Plan Note (Signed)
Chronic, improved. Trig remains elevated and HDL remains low. Discussed healthy choices to affect change.

## 2014-06-11 ENCOUNTER — Telehealth: Payer: Self-pay | Admitting: Family Medicine

## 2014-06-11 NOTE — Telephone Encounter (Signed)
emmi emailed °

## 2014-08-01 ENCOUNTER — Encounter: Payer: Self-pay | Admitting: Family Medicine

## 2014-09-09 ENCOUNTER — Telehealth: Payer: Self-pay | Admitting: Family Medicine

## 2014-09-09 ENCOUNTER — Ambulatory Visit: Payer: BLUE CROSS/BLUE SHIELD | Admitting: Family Medicine

## 2014-09-09 DIAGNOSIS — Z0289 Encounter for other administrative examinations: Secondary | ICD-10-CM

## 2014-09-09 NOTE — Telephone Encounter (Signed)
Patient did not come in for their appointment today for 3-4 month follow up.  Please let me know if patient needs to be contacted immediately for follow up or no follow up needed.

## 2014-09-09 NOTE — Telephone Encounter (Signed)
No f/u needed 

## 2015-01-22 LAB — HM DIABETES EYE EXAM

## 2015-07-31 ENCOUNTER — Telehealth: Payer: Self-pay | Admitting: *Deleted

## 2015-07-31 MED ORDER — OSELTAMIVIR PHOSPHATE 75 MG PO CAPS: 75.0000 mg | ORAL_CAPSULE | Freq: Two times a day (BID) | ORAL | Status: DC
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
## 2015-07-31 NOTE — Telephone Encounter (Signed)
plz notify tamiflu sent in to [facility]. Is pt experiencing symptoms or has he just been exposed to flu? If experiencing sxs, treatment is twice daily x 5 days. If preventative treatment then take once daily x 10 days. Will forward to [place] to review.

## 2015-07-31 NOTE — Telephone Encounter (Signed)
Forwarded to Dr. Darnell Level

## 2015-07-31 NOTE — Telephone Encounter (Signed)
Patient's wife notified. After speaking with her, she said she texted her husband yesterday at 5:06 PM (according to her phone) to tell him he needed to get the Tamiflu.The call he made would've gone through [facility] since it was after hours.

## 2015-08-17 ENCOUNTER — Emergency Department (HOSPITAL_COMMUNITY)
Admission: EM | Admit: 2015-08-17 | Discharge: 2015-08-17 | Disposition: A | Discharge status: Home / Self Care | Payer: BLUE CROSS/BLUE SHIELD | Attending: Emergency Medicine | Admitting: Emergency Medicine

## 2015-08-17 ENCOUNTER — Emergency Department (HOSPITAL_COMMUNITY): Payer: BLUE CROSS/BLUE SHIELD

## 2015-08-17 DIAGNOSIS — Y998 Other external cause status: Secondary | ICD-10-CM | POA: Diagnosis not present

## 2015-08-17 DIAGNOSIS — Z9981 Dependence on supplemental oxygen: Secondary | ICD-10-CM | POA: Insufficient documentation

## 2015-08-17 DIAGNOSIS — E119 Type 2 diabetes mellitus without complications: Secondary | ICD-10-CM | POA: Diagnosis not present

## 2015-08-17 DIAGNOSIS — E785 Hyperlipidemia, unspecified: Secondary | ICD-10-CM | POA: Insufficient documentation

## 2015-08-17 DIAGNOSIS — Y92410 Unspecified street and highway as the place of occurrence of the external cause: Secondary | ICD-10-CM | POA: Diagnosis not present

## 2015-08-17 DIAGNOSIS — Z7984 Long term (current) use of oral hypoglycemic drugs: Secondary | ICD-10-CM | POA: Diagnosis not present

## 2015-08-17 DIAGNOSIS — Z79899 Other long term (current) drug therapy: Secondary | ICD-10-CM | POA: Diagnosis not present

## 2015-08-17 DIAGNOSIS — Y9389 Activity, other specified: Secondary | ICD-10-CM | POA: Insufficient documentation

## 2015-08-17 DIAGNOSIS — S161XXA Strain of muscle, fascia and tendon at neck level, initial encounter: Secondary | ICD-10-CM | POA: Insufficient documentation

## 2015-08-17 DIAGNOSIS — G4730 Sleep apnea, unspecified: Secondary | ICD-10-CM | POA: Diagnosis not present

## 2015-08-17 DIAGNOSIS — Z8619 Personal history of other infectious and parasitic diseases: Secondary | ICD-10-CM | POA: Diagnosis not present

## 2015-08-17 DIAGNOSIS — I10 Essential (primary) hypertension: Secondary | ICD-10-CM | POA: Diagnosis not present

## 2015-08-17 DIAGNOSIS — Z7952 Long term (current) use of systemic steroids: Secondary | ICD-10-CM | POA: Diagnosis not present

## 2015-08-17 DIAGNOSIS — Z87891 Personal history of nicotine dependence: Secondary | ICD-10-CM | POA: Diagnosis not present

## 2015-08-17 DIAGNOSIS — S199XXA Unspecified injury of neck, initial encounter: Secondary | ICD-10-CM | POA: Diagnosis present

## 2015-08-17 DIAGNOSIS — Z8719 Personal history of other diseases of the digestive system: Secondary | ICD-10-CM | POA: Diagnosis not present

## 2015-08-17 MED ORDER — TRAMADOL HCL 50 MG PO TABS: 50.0000 mg | ORAL_TABLET | Freq: Four times a day (QID) | ORAL | Status: DC | PRN
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
## 2015-08-17 NOTE — Discharge Instructions (Signed)
Take Tylenol or Motrin for pain follow-up as needed
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
## 2015-09-01 ENCOUNTER — Other Ambulatory Visit: Payer: Self-pay | Admitting: Family Medicine

## 2015-09-01 NOTE — Telephone Encounter (Signed)
Pt request refill HCTZ,losartan and metformin to [facility]; pt has appt on 09/16/15 for CPX; last seen 06/10/14 for annual. Pt notified refilled x 1 and will get refills updated at appt.

## 2015-09-10 ENCOUNTER — Other Ambulatory Visit: Payer: Self-pay | Admitting: Family Medicine

## 2015-09-10 DIAGNOSIS — IMO0001 Reserved for inherently not codable concepts without codable children: Secondary | ICD-10-CM

## 2015-09-10 DIAGNOSIS — E785 Hyperlipidemia, unspecified: Secondary | ICD-10-CM

## 2015-09-10 DIAGNOSIS — E1165 Type 2 diabetes mellitus with hyperglycemia: Principal | ICD-10-CM

## 2015-09-10 DIAGNOSIS — I10 Essential (primary) hypertension: Secondary | ICD-10-CM

## 2015-09-11 ENCOUNTER — Other Ambulatory Visit (INDEPENDENT_AMBULATORY_CARE_PROVIDER_SITE_OTHER): Payer: BLUE CROSS/BLUE SHIELD

## 2015-09-11 DIAGNOSIS — IMO0001 Reserved for inherently not codable concepts without codable children: Secondary | ICD-10-CM

## 2015-09-11 DIAGNOSIS — E1165 Type 2 diabetes mellitus with hyperglycemia: Secondary | ICD-10-CM

## 2015-09-11 DIAGNOSIS — E785 Hyperlipidemia, unspecified: Secondary | ICD-10-CM | POA: Diagnosis not present

## 2015-09-11 DIAGNOSIS — I10 Essential (primary) hypertension: Secondary | ICD-10-CM | POA: Diagnosis not present

## 2015-09-11 LAB — LIPID PANEL
CHOLESTEROL: 217 mg/dL — AB (ref 0–200)
HDL: 32.9 mg/dL — ABNORMAL LOW (ref >39.00)
LDL CALC: 147 mg/dL — AB (ref 0–99)
NonHDL: 184.25
TRIGLYCERIDES: 185 mg/dL — AB (ref 0.0–149.0)
Total CHOL/HDL Ratio: 7
VLDL: 37 mg/dL (ref 0.0–40.0)

## 2015-09-11 LAB — COMPREHENSIVE METABOLIC PANEL
ALBUMIN: 4.5 g/dL (ref 3.5–5.2)
ALK PHOS: 65 U/L (ref 39–117)
ALT: 123 U/L — AB (ref 0–53)
AST: 99 U/L — AB (ref 0–37)
BILIRUBIN TOTAL: 1.2 mg/dL (ref 0.2–1.2)
BUN: 15 mg/dL (ref 6–23)
CALCIUM: 9.9 mg/dL (ref 8.4–10.5)
CO2: 34 mEq/L — ABNORMAL HIGH (ref 19–32)
CREATININE: 1.31 mg/dL (ref 0.40–1.50)
Chloride: 101 mEq/L (ref 96–112)
GFR: 63.51 mL/min (ref >60.00)
Glucose, Bld: 146 mg/dL — ABNORMAL HIGH (ref 70–99)
Potassium: 4.2 mEq/L (ref 3.5–5.1)
SODIUM: 142 meq/L (ref 135–145)
TOTAL PROTEIN: 7.5 g/dL (ref 6.0–8.3)

## 2015-09-11 LAB — HEMOGLOBIN A1C: HEMOGLOBIN A1C: 6.8 % — AB (ref 4.6–6.5)

## 2015-09-11 LAB — MICROALBUMIN / CREATININE URINE RATIO
Creatinine,U: 293.1 mg/dL
MICROALB UR: 5.2 mg/dL — AB (ref 0.0–1.9)
Microalb Creat Ratio: 1.8 mg/g (ref 0.0–30.0)

## 2015-09-16 ENCOUNTER — Ambulatory Visit (INDEPENDENT_AMBULATORY_CARE_PROVIDER_SITE_OTHER): Payer: BLUE CROSS/BLUE SHIELD | Admitting: Family Medicine

## 2015-09-16 ENCOUNTER — Encounter: Payer: Self-pay | Admitting: Family Medicine

## 2015-09-16 ENCOUNTER — Other Ambulatory Visit: Payer: Self-pay | Admitting: *Deleted

## 2015-09-16 VITALS — BP 122/90 | HR 104 | Temp 98.5°F | Ht 70.0 in | Wt 228.8 lb

## 2015-09-16 DIAGNOSIS — E785 Hyperlipidemia, unspecified: Secondary | ICD-10-CM

## 2015-09-16 DIAGNOSIS — Z658 Other specified problems related to psychosocial circumstances: Secondary | ICD-10-CM

## 2015-09-16 DIAGNOSIS — R6889 Other general symptoms and signs: Secondary | ICD-10-CM

## 2015-09-16 DIAGNOSIS — K760 Fatty (change of) liver, not elsewhere classified: Secondary | ICD-10-CM

## 2015-09-16 DIAGNOSIS — R93422 Abnormal radiologic findings on diagnostic imaging of left kidney: Secondary | ICD-10-CM

## 2015-09-16 DIAGNOSIS — I10 Essential (primary) hypertension: Secondary | ICD-10-CM

## 2015-09-16 DIAGNOSIS — F439 Reaction to severe stress, unspecified: Secondary | ICD-10-CM

## 2015-09-16 DIAGNOSIS — E669 Obesity, unspecified: Secondary | ICD-10-CM | POA: Diagnosis not present

## 2015-09-16 DIAGNOSIS — N281 Cyst of kidney, acquired: Secondary | ICD-10-CM

## 2015-09-16 DIAGNOSIS — E66811 Obesity, class 1: Secondary | ICD-10-CM

## 2015-09-16 DIAGNOSIS — Z0001 Encounter for general adult medical examination with abnormal findings: Secondary | ICD-10-CM | POA: Diagnosis not present

## 2015-09-16 DIAGNOSIS — E119 Type 2 diabetes mellitus without complications: Secondary | ICD-10-CM

## 2015-09-16 DIAGNOSIS — M5442 Lumbago with sciatica, left side: Secondary | ICD-10-CM | POA: Insufficient documentation

## 2015-09-16 HISTORY — DX: Cyst of kidney, acquired: N28.1

## 2015-09-16 LAB — POC URINALSYSI DIPSTICK (AUTOMATED)
Bilirubin, UA: NEGATIVE
Ketones, UA: NEGATIVE
Leukocytes, UA: NEGATIVE
NITRITE UA: NEGATIVE
PROTEIN UA: NEGATIVE
RBC UA: NEGATIVE
Spec Grav, UA: >=1.03
UROBILINOGEN UA: 0.2
pH, UA: 6

## 2015-09-16 MED ORDER — HYDROCODONE-ACETAMINOPHEN 5-325 MG PO TABS: 0.5000 | ORAL_TABLET | Freq: Two times a day (BID) | ORAL | Status: DC | PRN

## 2015-09-16 MED ORDER — ATORVASTATIN CALCIUM 40 MG PO TABS: 40.0000 mg | ORAL_TABLET | Freq: Every day | ORAL | Status: DC

## 2015-09-16 NOTE — Assessment & Plan Note (Signed)
Persistent transaminitis. Update abd [place].

## 2015-09-16 NOTE — Assessment & Plan Note (Signed)
Update abd us 

## 2015-09-16 NOTE — Assessment & Plan Note (Signed)
Discussed healthy diet and lifestyle changes to affect sustainable weight loss  
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
## 2015-09-16 NOTE — Assessment & Plan Note (Signed)
Chronic, stable on higher metformin XR dose. Continue.  Congratulated on healthy diet changes to date. F/u 4 mo

## 2015-09-16 NOTE — Addendum Note (Signed)
Addended by: Ria Bush on: 09/16/2015 11:52 AM   Modules accepted: Orders, SmartSet

## 2015-09-16 NOTE — Assessment & Plan Note (Signed)
Preventative protocols reviewed and updated unless pt declined. Discussed healthy diet and lifestyle.  

## 2015-09-16 NOTE — Progress Notes (Signed)
Pre visit review using our clinic review tool, if applicable. No additional management support is needed unless otherwise documented below in the visit note. 

## 2015-09-16 NOTE — Assessment & Plan Note (Signed)
Deteriorated - but pt has run out of lipitor for last 3 weeks. Has since restarted. Recheck next labs. RTC 4 mo f/u visit

## 2015-09-16 NOTE — Assessment & Plan Note (Signed)
Discussed current stressors. Discussed healthy stress relieving strategies. Pt will return for further eval if stressors become overwhelming

## 2015-09-16 NOTE — Assessment & Plan Note (Addendum)
Anticipate L sciatica without piriformis syndrome. Reviewed dx and management options. Recommended walking, avoiding heavy lifting, and encouraged weight loss. Discussed continuing PRN meds. Provided with exercises from [facility] pt advisor.  Update if red flags of weakness, worsening pain for further eval/imaging Pt states tramadol causes nausea, want to avoid NSAIDs due to h/o mild renal insufficiency. Will prescribe short hydrocodone course.

## 2015-09-16 NOTE — Assessment & Plan Note (Signed)
Chronic, stable. Continue current regimen.  Mildly elevated diastolics - pt will monitor at home and let me know if persistently elevated to change regimen.

## 2015-09-16 NOTE — Addendum Note (Signed)
Addended by: Royann Shivers A on: 09/16/2015 12:10 PM   Modules accepted: Orders

## 2015-09-16 NOTE — Patient Instructions (Addendum)
Urinalysis today. We will schedule repeat abdominal ultrasound to check liver and left kidney again. Good to see you today, call us with questions. Return in 4 months for follow up labs and diabetes.  Sounds like left sided sciatica - try stretching exercises provided today, stay as active as able (walking), may use ibuprofen or tramadol as needed for discomfort. Should improve over 4-6 weeks.   Health Maintenance, Male A healthy lifestyle and preventative care can promote health and wellness.  Maintain regular health, dental, and eye exams.  Eat a healthy diet. Foods like vegetables, fruits, whole grains, low-fat dairy products, and lean protein foods contain the nutrients you need and are low in calories. Decrease your intake of foods high in solid fats, added sugars, and salt. Get information about a proper diet from your health care provider, if necessary.  Regular physical exercise is one of the most important things you can do for your health. Most adults should get at least 150 minutes of moderate-intensity exercise (any activity that increases your heart rate and causes you to sweat) each week. In addition, most adults need muscle-strengthening exercises on 2 or more days a week.   Maintain a healthy weight. The body mass index (BMI) is a screening tool to identify possible weight problems. It provides an estimate of body fat based on height and weight. Your health care provider can find your BMI and can help you achieve or maintain a healthy weight. For males 20 years and older:  A BMI below 18.5 is considered underweight.  A BMI of 18.5 to 24.9 is normal.  A BMI of 25 to 29.9 is considered overweight.  A BMI of 30 and above is considered obese.  Maintain normal blood lipids and cholesterol by exercising and minimizing your intake of saturated fat. Eat a balanced diet with plenty of fruits and vegetables. Blood tests for lipids and cholesterol should begin at age 22 and be repeated  every 5 years. If your lipid or cholesterol levels are high, you are over age 22, or you are at high risk for heart disease, you may need your cholesterol levels checked more frequently.Ongoing high lipid and cholesterol levels should be treated with medicines if diet and exercise are not working.  If you smoke, find out from your health care provider how to quit. If you do not use tobacco, do not start.  Lung cancer screening is recommended for adults aged 41-80 years who are at high risk for developing lung cancer because of a history of smoking. A yearly low-dose CT scan of the lungs is recommended for people who have at least a 30-pack-year history of smoking and are current smokers or have quit within the past 15 years. A pack year of smoking is smoking an average of 1 pack of cigarettes a day for 1 year (for example, a 30-pack-year history of smoking could mean smoking 1 pack a day for 30 years or 2 packs a day for 15 years). Yearly screening should continue until the smoker has stopped smoking for at least 15 years. Yearly screening should be stopped for people who develop a health problem that would prevent them from having lung cancer treatment.  If you choose to drink alcohol, do not have more than 2 drinks per day. One drink is considered to be 12 oz (360 mL) of beer, 5 oz (150 mL) of wine, or 1.5 oz (45 mL) of liquor.  Avoid the use of street drugs. Do not share needles with  anyone. Ask for help if you need support or instructions about stopping the use of drugs.  High blood pressure causes heart disease and increases the risk of stroke. High blood pressure is more likely to develop in:  People who have blood pressure in the end of the normal range (100-139/85-89 mm Hg).  People who are overweight or obese.  People who are African American.  If you are 43-59 years of age, have your blood pressure checked every 3-5 years. If you are 9 years of age or older, have your blood pressure  checked every year. You should have your blood pressure measured twice--once when you are at a hospital or clinic, and once when you are not at a hospital or clinic. Record the average of the two measurements. To check your blood pressure when you are not at a hospital or clinic, you can use:  An automated blood pressure machine at a pharmacy.  A home blood pressure monitor.  If you are 28-25 years old, ask your health care provider if you should take aspirin to prevent heart disease.  Diabetes screening involves taking a blood sample to check your fasting blood sugar level. This should be done once every 3 years after age 66 if you are at a normal weight and without risk factors for diabetes. Testing should be considered at a younger age or be carried out more frequently if you are overweight and have at least 1 risk factor for diabetes.  Colorectal cancer can be detected and often prevented. Most routine colorectal cancer screening begins at the age of 43 and continues through age 9. However, your health care provider may recommend screening at an earlier age if you have risk factors for colon cancer. On a yearly basis, your health care provider may provide home test kits to check for hidden blood in the stool. A small camera at the end of a tube may be used to directly examine the colon (sigmoidoscopy or colonoscopy) to detect the earliest forms of colorectal cancer. Talk to your health care provider about this at age 16 when routine screening begins. A direct exam of the colon should be repeated every 5-10 years through age 65, unless early forms of precancerous polyps or small growths are found.  People who are at an increased risk for hepatitis B should be screened for this virus. You are considered at high risk for hepatitis B if:  You were born in a country where hepatitis B occurs often. Talk with your health care provider about which countries are considered high risk.  Your parents were  born in a high-risk country and you have not received a shot to protect against hepatitis B (hepatitis B vaccine).  You have HIV or AIDS.  You use needles to inject street drugs.  You live with, or have sex with, someone who has hepatitis B.  You are a man who has sex with other men (MSM).  You get hemodialysis treatment.  You take certain medicines for conditions like cancer, organ transplantation, and autoimmune conditions.  Hepatitis C blood testing is recommended for all people born from [birth year] through 1963 and any individual with known risk factors for hepatitis C.  Healthy men should no longer receive prostate-specific antigen (PSA) blood tests as part of routine cancer screening. Talk to your health care provider about prostate cancer screening.  Testicular cancer screening is not recommended for adolescents or adult males who have no symptoms. Screening includes self-exam, a health care provider exam,  and other screening tests. Consult with your health care provider about any symptoms you have or any concerns you have about testicular cancer.  Practice safe sex. Use condoms and avoid high-risk sexual practices to reduce the spread of sexually transmitted infections (STIs).  You should be screened for STIs, including gonorrhea and chlamydia if:  You are sexually active and are younger than 24 years.  You are older than 24 years, and your health care provider tells you that you are at risk for this type of infection.  Your sexual activity has changed since you were last screened, and you are at an increased risk for chlamydia or gonorrhea. Ask your health care provider if you are at risk.  If you are at risk of being infected with HIV, it is recommended that you take a prescription medicine daily to prevent HIV infection. This is called pre-exposure prophylaxis (PrEP). You are considered at risk if:  You are a man who has sex with other men (MSM).  You are a heterosexual man who  is sexually active with multiple partners.  You take drugs by injection.  You are sexually active with a partner who has HIV.  Talk with your health care provider about whether you are at high risk of being infected with HIV. If you choose to begin PrEP, you should first be tested for HIV. You should then be tested every 3 months for as long as you are taking PrEP.  Use sunscreen. Apply sunscreen liberally and repeatedly throughout the day. You should seek shade when your shadow is shorter than you. Protect yourself by wearing long sleeves, pants, a wide-brimmed hat, and sunglasses year round whenever you are outdoors.  Tell your health care provider of new moles or changes in moles, especially if there is a change in shape or color. Also, tell your health care provider if a mole is larger than the size of a pencil eraser.  A one-time screening for abdominal aortic aneurysm (AAA) and surgical repair of large AAAs by ultrasound is recommended for men aged 65-75 years who are current or former smokers.  Stay current with your vaccines (immunizations).   This information is not intended to replace advice given to you by your health care provider. Make sure you discuss any questions you have with your health care provider.   Document Released: 11/05/2007 Document Revised: 05/30/2014 Document Reviewed: 10/04/2010 Elsevier Interactive Patient Education [employer].

## 2015-09-25 ENCOUNTER — Ambulatory Visit
Admission: RE | Admit: 2015-09-25 | Discharge: 2015-09-25 | Disposition: A | Discharge status: Home / Self Care | Payer: BLUE CROSS/BLUE SHIELD | Source: Ambulatory Visit | Attending: Family Medicine | Admitting: Family Medicine

## 2015-09-25 DIAGNOSIS — K760 Fatty (change of) liver, not elsewhere classified: Secondary | ICD-10-CM | POA: Diagnosis not present

## 2015-09-25 DIAGNOSIS — N281 Cyst of kidney, acquired: Secondary | ICD-10-CM

## 2015-09-25 DIAGNOSIS — M5442 Lumbago with sciatica, left side: Secondary | ICD-10-CM

## 2015-09-25 HISTORY — DX: Cyst of kidney, acquired: N28.1

## 2015-10-02 ENCOUNTER — Other Ambulatory Visit: Payer: Self-pay | Admitting: Family Medicine

## 2016-01-08 DIAGNOSIS — K642 Third degree hemorrhoids: Secondary | ICD-10-CM | POA: Diagnosis not present

## 2016-01-08 DIAGNOSIS — K625 Hemorrhage of anus and rectum: Secondary | ICD-10-CM | POA: Diagnosis not present

## 2016-01-13 ENCOUNTER — Other Ambulatory Visit: Payer: Self-pay | Admitting: Family Medicine

## 2016-01-13 DIAGNOSIS — K760 Fatty (change of) liver, not elsewhere classified: Secondary | ICD-10-CM

## 2016-01-13 DIAGNOSIS — E119 Type 2 diabetes mellitus without complications: Secondary | ICD-10-CM

## 2016-01-13 DIAGNOSIS — I10 Essential (primary) hypertension: Secondary | ICD-10-CM

## 2016-01-13 DIAGNOSIS — E785 Hyperlipidemia, unspecified: Secondary | ICD-10-CM

## 2016-01-14 ENCOUNTER — Other Ambulatory Visit (INDEPENDENT_AMBULATORY_CARE_PROVIDER_SITE_OTHER): Payer: BLUE CROSS/BLUE SHIELD

## 2016-01-14 DIAGNOSIS — E785 Hyperlipidemia, unspecified: Secondary | ICD-10-CM | POA: Diagnosis not present

## 2016-01-14 DIAGNOSIS — I10 Essential (primary) hypertension: Secondary | ICD-10-CM | POA: Diagnosis not present

## 2016-01-14 DIAGNOSIS — E119 Type 2 diabetes mellitus without complications: Secondary | ICD-10-CM

## 2016-01-14 DIAGNOSIS — K760 Fatty (change of) liver, not elsewhere classified: Secondary | ICD-10-CM | POA: Diagnosis not present

## 2016-01-14 LAB — COMPREHENSIVE METABOLIC PANEL
ALK PHOS: 68 U/L (ref 39–117)
ALT: 63 U/L — AB (ref 0–53)
AST: 50 U/L — AB (ref 0–37)
Albumin: 4.3 g/dL (ref 3.5–5.2)
BILIRUBIN TOTAL: 1.4 mg/dL — AB (ref 0.2–1.2)
BUN: 17 mg/dL (ref 6–23)
CO2: 29 mEq/L (ref 19–32)
CREATININE: 1.32 mg/dL (ref 0.40–1.50)
Calcium: 8.8 mg/dL (ref 8.4–10.5)
Chloride: 106 mEq/L (ref 96–112)
GFR: 62.85 mL/min (ref >60.00)
GLUCOSE: 138 mg/dL — AB (ref 70–99)
POTASSIUM: 4 meq/L (ref 3.5–5.1)
SODIUM: 142 meq/L (ref 135–145)
TOTAL PROTEIN: 7 g/dL (ref 6.0–8.3)

## 2016-01-14 LAB — HEMOGLOBIN A1C: Hgb A1c MFr Bld: 6.5 % (ref 4.6–6.5)

## 2016-01-14 LAB — LIPID PANEL
CHOLESTEROL: 132 mg/dL (ref 0–200)
HDL: 36.7 mg/dL — ABNORMAL LOW (ref >39.00)
LDL Cholesterol: 68 mg/dL (ref 0–99)
NONHDL: 95.45
Total CHOL/HDL Ratio: 4
Triglycerides: 136 mg/dL (ref 0.0–149.0)
VLDL: 27.2 mg/dL (ref 0.0–40.0)

## 2016-01-18 ENCOUNTER — Encounter: Payer: Self-pay | Admitting: Family Medicine

## 2016-01-18 ENCOUNTER — Ambulatory Visit (INDEPENDENT_AMBULATORY_CARE_PROVIDER_SITE_OTHER): Payer: BLUE CROSS/BLUE SHIELD | Admitting: Family Medicine

## 2016-01-18 VITALS — BP 126/88 | HR 56 | Temp 99.0°F | Wt 222.2 lb

## 2016-01-18 DIAGNOSIS — E785 Hyperlipidemia, unspecified: Secondary | ICD-10-CM

## 2016-01-18 DIAGNOSIS — R55 Syncope and collapse: Secondary | ICD-10-CM

## 2016-01-18 DIAGNOSIS — E119 Type 2 diabetes mellitus without complications: Secondary | ICD-10-CM

## 2016-01-18 DIAGNOSIS — I10 Essential (primary) hypertension: Secondary | ICD-10-CM

## 2016-01-18 DIAGNOSIS — N281 Cyst of kidney, acquired: Secondary | ICD-10-CM

## 2016-01-18 DIAGNOSIS — Q6100 Congenital renal cyst, unspecified: Secondary | ICD-10-CM

## 2016-01-18 DIAGNOSIS — Z23 Encounter for immunization: Secondary | ICD-10-CM | POA: Diagnosis not present

## 2016-01-18 DIAGNOSIS — E66811 Obesity, class 1: Secondary | ICD-10-CM

## 2016-01-18 DIAGNOSIS — K760 Fatty (change of) liver, not elsewhere classified: Secondary | ICD-10-CM

## 2016-01-18 DIAGNOSIS — E669 Obesity, unspecified: Secondary | ICD-10-CM

## 2016-01-18 MED ORDER — LOSARTAN POTASSIUM 50 MG PO TABS: 50.0000 mg | ORAL_TABLET | Freq: Every day | ORAL | 3 refills | Status: DC

## 2016-01-18 MED ORDER — HYDROCHLOROTHIAZIDE 25 MG PO TABS: 25.0000 mg | ORAL_TABLET | Freq: Every day | ORAL | 3 refills | Status: DC

## 2016-01-18 MED ORDER — METFORMIN HCL ER 500 MG PO TB24: 1500.0000 mg | ORAL_TABLET | Freq: Every day | ORAL | 3 refills | Status: DC

## 2016-01-18 MED ORDER — GLUCOSE BLOOD VI STRP: ORAL_STRIP | 3 refills | Status: DC

## 2016-01-18 NOTE — Progress Notes (Signed)
Pre visit review using our clinic review tool, if applicable. No additional management support is needed unless otherwise documented below in the visit note. 

## 2016-01-18 NOTE — Assessment & Plan Note (Signed)
Persistent transaminitis albeit improving with healthier diet/lifestyle and weight loss. Continue to monitor.

## 2016-01-18 NOTE — Progress Notes (Signed)
BP 126/88   Pulse (!) 56   Temp 99 F (37.2 C) (Oral)   Wt 222 lb 4 oz (100.8 kg)   BMI 31.89 kg/m    CC: f/u visit Subjective:    Patient ID: Michael Mcconnell, male    DOB: 07-21-1970, 45 y.o.   MRN: [id number]  HPI: Michael Mcconnell is a 45 y.o. male presenting on 01/18/2016 for Follow-up (wants 90 days Rx's)   Occasional presyncope (4x in last few years) - has not checked BP or sugar with these episodes. Lasts 2-3 min. No palpitations, headaches, LOC.   HTN - compliant and stable on hctz 25mg , losartan 50mg  daily. No vision changes, CP/tightness, SOB, leg swelling. Rare headache. Home BP running well controlled. No low bp sxs.   DM - compliant with metformin XR 1500mg  with breakfast. No diarrhea. Doesn't check sugars. Has glucose meter (one-touch) at home but no strips. No paresthesias. No low cbgs. Pneumovax today. Overdue for eye exam (01/2015).   HLD - complaint with lipitor 40mg  daily. No myalgias.   OSA - doesn't tolerate CPAP. Some non restorative sleep. Stomach or side sleeper.  Trouble staying active. Just went to 10 hour days 4d/wk at work. Enjoys treadmill, basketball with son.  Relevant past medical, surgical, family and social history reviewed and updated as indicated. Interim medical history since our last visit reviewed. Allergies and medications reviewed and updated. Current Outpatient Prescriptions on File Prior to Visit  Medication Sig  . atorvastatin (LIPITOR) 40 MG tablet Take 1 tablet (40 mg total) by mouth daily at 6 PM.  . HYDROcodone-acetaminophen (NORCO/VICODIN) 5-325 MG tablet Take 0.5-1 tablets by mouth 2 (two) times daily as needed for moderate pain.  Marland Kitchen ibuprofen (ADVIL,MOTRIN) 600 MG tablet Take 1 tablet (600 mg total) by mouth every 8 (eight) hours as needed.  . Multiple Vitamins-Minerals (MULTIVITAMIN PO) Take 1 capsule by mouth daily.   No current facility-administered medications on file prior to visit.     Review of Systems Per HPI unless  specifically indicated in ROS section     Objective:    BP 126/88   Pulse (!) 56   Temp 99 F (37.2 C) (Oral)   Wt 222 lb 4 oz (100.8 kg)   BMI 31.89 kg/m   Wt Readings from Last 3 Encounters:  01/18/16 222 lb 4 oz (100.8 kg)  09/16/15 228 lb 12 oz (103.8 kg)  06/10/14 229 lb 4 oz (104 kg)    Physical Exam  Constitutional: He appears well-developed and well-nourished. No distress.  HENT:  Head: Normocephalic and atraumatic.  Right Ear: External ear normal.  Left Ear: External ear normal.  Nose: Nose normal.  Mouth/Throat: Oropharynx is clear and moist. No oropharyngeal exudate.  Eyes: Conjunctivae and EOM are normal. Pupils are equal, round, and reactive to light. No scleral icterus.  Neck: Normal range of motion. Neck supple.  Cardiovascular: Normal rate, regular rhythm, normal heart sounds and intact distal pulses.   No murmur heard. Pulmonary/Chest: Effort normal and breath sounds normal. No respiratory distress. He has no wheezes. He has no rales.  Musculoskeletal: He exhibits no edema.  See HPI for foot exam if done  Lymphadenopathy:    He has no cervical adenopathy.  Skin: Skin is warm and dry. No rash noted.  Psychiatric: He has a normal mood and affect.  Nursing note and vitals reviewed.  Results for orders placed or performed in visit on 01/18/16  HM DIABETES EYE EXAM  Result Value Ref  Range   HM Diabetic Eye Exam No Retinopathy No Retinopathy      Assessment & Plan:   Problem List Items Addressed This Visit    Cyst of left kidney    Reviewed recent abd [place] with patient. UA ok 08/2015. Offered MRI - pt declines for now. Recheck renal US next year.       Diabetes type 2, controlled ([place]) - Primary    Chronic, improved. Continue metformin 1500mg  XR daily.      Relevant Medications   losartan (COZAAR) 50 MG tablet   metFORMIN (GLUCOPHAGE-XR) 500 MG 24 hr tablet   Dyslipidemia    Chronic, improved with healthy diet and lifestyle changes and weight loss.  Congratulated.       Hypertension    Chronic, stable. Continue current regimen.       Relevant Medications   hydrochlorothiazide (HYDRODIURIL) 25 MG tablet   losartan (COZAAR) 50 MG tablet   NAFLD (nonalcoholic fatty liver disease)    Persistent transaminitis albeit improving with healthier diet/lifestyle and weight loss. Continue to monitor.       Obesity, Class I, BMI 30-34.9    Congratulated on weight loss noted. Provided with active lifestyle recommendations.       Relevant Medications   metFORMIN (GLUCOPHAGE-XR) 500 MG 24 hr tablet   Pre-syncope    Advised monitor BP, cbg next episode. Update me if recurrent.       Relevant Medications   hydrochlorothiazide (HYDRODIURIL) 25 MG tablet   losartan (COZAAR) 50 MG tablet    Other Visit Diagnoses    Need for influenza vaccination       Relevant Orders   Flu Vaccine QUAD 36+ mos PF IM (Fluarix & Fluzone Quad PF) (Completed)       Follow up plan: Return in about 8 months (around 09/17/2016), or as needed, for annual exam, prior fasting for blood work.  Ria Bush, MD

## 2016-01-18 NOTE — Patient Instructions (Addendum)
Flu shot today. Pneumovax today. We will continue to watch kidneys with yearly ultrasound. Let me know if you decide to proceed with MRI.  Next near-passing out episode, make sure to check blood pressure and sugar.  Sugar strips sent to pharmacy today.  Return as needed or in 8 months for physical.

## 2016-01-18 NOTE — Assessment & Plan Note (Signed)
Chronic, stable. Continue current regimen. 

## 2016-01-18 NOTE — Assessment & Plan Note (Signed)
Chronic, improved. Continue metformin 1500mg  XR daily.

## 2016-01-18 NOTE — Assessment & Plan Note (Signed)
Reviewed recent abd [place] with patient. UA ok 08/2015. Offered MRI - pt declines for now. Recheck renal US next year.

## 2016-01-18 NOTE — Assessment & Plan Note (Signed)
Chronic, improved with healthy diet and lifestyle changes and weight loss. Congratulated.

## 2016-01-18 NOTE — Assessment & Plan Note (Signed)
Advised monitor BP, cbg next episode. Update me if recurrent.

## 2016-01-18 NOTE — Assessment & Plan Note (Signed)
Congratulated on weight loss noted. Provided with active lifestyle recommendations.

## 2016-01-18 NOTE — Addendum Note (Signed)
Addended by: Royann Shivers A on: 01/18/2016 10:03 AM   Modules accepted: Orders

## 2016-01-18 NOTE — Addendum Note (Signed)
Addended by: Ria Bush on: 01/18/2016 11:22 AM   Modules accepted: Orders

## 2016-09-17 ENCOUNTER — Other Ambulatory Visit: Payer: Self-pay | Admitting: Family Medicine

## 2016-09-17 DIAGNOSIS — E119 Type 2 diabetes mellitus without complications: Secondary | ICD-10-CM

## 2016-09-17 DIAGNOSIS — E785 Hyperlipidemia, unspecified: Secondary | ICD-10-CM

## 2016-09-17 DIAGNOSIS — K760 Fatty (change of) liver, not elsewhere classified: Secondary | ICD-10-CM

## 2016-09-21 ENCOUNTER — Other Ambulatory Visit (INDEPENDENT_AMBULATORY_CARE_PROVIDER_SITE_OTHER): Payer: BLUE CROSS/BLUE SHIELD

## 2016-09-21 DIAGNOSIS — E785 Hyperlipidemia, unspecified: Secondary | ICD-10-CM | POA: Diagnosis not present

## 2016-09-21 DIAGNOSIS — E119 Type 2 diabetes mellitus without complications: Secondary | ICD-10-CM

## 2016-09-21 DIAGNOSIS — K760 Fatty (change of) liver, not elsewhere classified: Secondary | ICD-10-CM

## 2016-09-21 LAB — COMPREHENSIVE METABOLIC PANEL
ALBUMIN: 4.6 g/dL (ref 3.5–5.2)
ALT: 112 U/L — AB (ref 0–53)
AST: 69 U/L — AB (ref 0–37)
Alkaline Phosphatase: 65 U/L (ref 39–117)
BILIRUBIN TOTAL: 1.7 mg/dL — AB (ref 0.2–1.2)
BUN: 22 mg/dL (ref 6–23)
CALCIUM: 9.7 mg/dL (ref 8.4–10.5)
CHLORIDE: 104 meq/L (ref 96–112)
CO2: 30 mEq/L (ref 19–32)
CREATININE: 1.42 mg/dL (ref 0.40–1.50)
GFR: 57.59 mL/min — ABNORMAL LOW (ref >60.00)
Glucose, Bld: 137 mg/dL — ABNORMAL HIGH (ref 70–99)
Potassium: 3.9 mEq/L (ref 3.5–5.1)
SODIUM: 141 meq/L (ref 135–145)
TOTAL PROTEIN: 7.7 g/dL (ref 6.0–8.3)

## 2016-09-21 LAB — LIPID PANEL
Cholesterol: 201 mg/dL — ABNORMAL HIGH (ref 0–200)
HDL: 34.1 mg/dL — AB (ref >39.00)
LDL Cholesterol: 131 mg/dL — ABNORMAL HIGH (ref 0–99)
NonHDL: 166.71
TRIGLYCERIDES: 180 mg/dL — AB (ref 0.0–149.0)
Total CHOL/HDL Ratio: 6
VLDL: 36 mg/dL (ref 0.0–40.0)

## 2016-09-21 LAB — HEMOGLOBIN A1C: HEMOGLOBIN A1C: 7 % — AB (ref 4.6–6.5)

## 2016-09-21 LAB — IRON: Iron: 107 ug/dL (ref 42–165)

## 2016-09-21 LAB — TSH: TSH: 2.75 u[IU]/mL (ref 0.35–4.50)

## 2016-09-23 ENCOUNTER — Encounter: Payer: Self-pay | Admitting: Family Medicine

## 2016-09-23 ENCOUNTER — Ambulatory Visit (INDEPENDENT_AMBULATORY_CARE_PROVIDER_SITE_OTHER): Payer: BLUE CROSS/BLUE SHIELD | Admitting: Family Medicine

## 2016-09-23 VITALS — BP 108/70 | HR 108 | Ht 70.0 in | Wt 224.8 lb

## 2016-09-23 DIAGNOSIS — K648 Other hemorrhoids: Secondary | ICD-10-CM | POA: Diagnosis not present

## 2016-09-23 DIAGNOSIS — E785 Hyperlipidemia, unspecified: Secondary | ICD-10-CM

## 2016-09-23 DIAGNOSIS — E119 Type 2 diabetes mellitus without complications: Secondary | ICD-10-CM

## 2016-09-23 DIAGNOSIS — Z0001 Encounter for general adult medical examination with abnormal findings: Secondary | ICD-10-CM | POA: Diagnosis not present

## 2016-09-23 DIAGNOSIS — N281 Cyst of kidney, acquired: Secondary | ICD-10-CM | POA: Diagnosis not present

## 2016-09-23 DIAGNOSIS — I10 Essential (primary) hypertension: Secondary | ICD-10-CM

## 2016-09-23 DIAGNOSIS — E669 Obesity, unspecified: Secondary | ICD-10-CM

## 2016-09-23 DIAGNOSIS — K760 Fatty (change of) liver, not elsewhere classified: Secondary | ICD-10-CM | POA: Diagnosis not present

## 2016-09-23 LAB — POCT URINALYSIS DIPSTICK
Bilirubin, UA: NEGATIVE
Blood, UA: NEGATIVE
GLUCOSE UA: NEGATIVE
Ketones, UA: NEGATIVE
LEUKOCYTES UA: NEGATIVE
Nitrite, UA: NEGATIVE
PROTEIN UA: NEGATIVE
UROBILINOGEN UA: 0.2 U/dL
pH, UA: 6 (ref 5.0–8.0)

## 2016-09-23 MED ORDER — HYDROCORTISONE ACETATE 25 MG RE SUPP: 25.0000 mg | Freq: Two times a day (BID) | RECTAL | 0 refills | Status: DC

## 2016-09-23 NOTE — Assessment & Plan Note (Signed)
Anticipate NASH. Iron and TSH normal. abd US showing fatty changes. Will need viral hepatitis panel.
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
## 2016-09-23 NOTE — Assessment & Plan Note (Signed)
Anticipate mild flare today of posterior external hemorrhoid as well as pt endorsed symptoms of internal hemorrhoid - will treat with anusol suppositories.

## 2016-09-23 NOTE — Assessment & Plan Note (Signed)
Chronic, stable. Today on low side of normal. Continue hctz, losartan.

## 2016-09-23 NOTE — Patient Instructions (Addendum)
Urinalysis today. See marion to schedule kidney MRI.  Use steroid suppository for hemorrhoids.  Work on [employer] now you're back at home. Return in 6 months for follow up visit with labs.   Health Maintenance, Male A healthy lifestyle and preventive care is important for your health and wellness. Ask your health care provider about what schedule of regular examinations is right for you. What should I know about weight and diet?  Eat a Healthy Diet  Eat plenty of vegetables, fruits, whole grains, low-fat dairy products, and lean protein.  Do not eat a lot of foods high in solid fats, added sugars, or salt. Maintain a Healthy Weight  Regular exercise can help you achieve or maintain a healthy weight. You should:  Do at least 150 minutes of exercise each week. The exercise should increase your heart rate and make you sweat (moderate-intensity exercise).  Do strength-training exercises at least twice a week. Watch Your Levels of Cholesterol and Blood Lipids  Have your blood tested for lipids and cholesterol every 5 years starting at 44 years of age. If you are at high risk for heart disease, you should start having your blood tested when you are 44 years old. You may need to have your cholesterol levels checked more often if:  Your lipid or cholesterol levels are high.  You are older than 44 years of age.  You are at high risk for heart disease. What should I know about cancer screening? Many types of cancers can be detected early and may often be prevented. Lung Cancer  You should be screened every year for lung cancer if:  You are a current smoker who has smoked for at least 30 years.  You are a former smoker who has quit within the past 15 years.  Talk to your health care provider about your screening options, when you should start screening, and how often you should be screened. Colorectal Cancer  Routine colorectal cancer screening usually begins at 44 years of age and  should be repeated every 5-10 years until you are 44 years old. You may need to be screened more often if early forms of precancerous polyps or small growths are found. Your health care provider may recommend screening at an earlier age if you have risk factors for colon cancer.  Your health care provider may recommend using home test kits to check for hidden blood in the stool.  A small camera at the end of a tube can be used to examine your colon (sigmoidoscopy or colonoscopy). This checks for the earliest forms of colorectal cancer. Prostate and Testicular Cancer  Depending on your age and overall health, your health care provider may do certain tests to screen for prostate and testicular cancer.  Talk to your health care provider about any symptoms or concerns you have about testicular or prostate cancer. Skin Cancer  Check your skin from head to toe regularly.  Tell your health care provider about any new moles or changes in moles, especially if:  There is a change in a mole's size, shape, or color.  You have a mole that is larger than a pencil eraser.  Always use sunscreen. Apply sunscreen liberally and repeat throughout the day.  Protect yourself by wearing long sleeves, pants, a wide-brimmed hat, and sunglasses when outside. What should I know about heart disease, diabetes, and high blood pressure?  If you are 3-6 years of age, have your blood pressure checked every 3-5 years. If you are 40  years of age or older, have your blood pressure checked every year. You should have your blood pressure measured twice-once when you are at a hospital or clinic, and once when you are not at a hospital or clinic. Record the average of the two measurements. To check your blood pressure when you are not at a hospital or clinic, you can use:  An automated blood pressure machine at a pharmacy.  A home blood pressure monitor.  Talk to your health care provider about your target blood  pressure.  If you are between 54-45 years old, ask your health care provider if you should take aspirin to prevent heart disease.  Have regular diabetes screenings by checking your fasting blood sugar level.  If you are at a normal weight and have a low risk for diabetes, have this test once every three years after the age of 22.  If you are overweight and have a high risk for diabetes, consider being tested at a younger age or more often.  A one-time screening for abdominal aortic aneurysm (AAA) by ultrasound is recommended for men aged 41-75 years who are current or former smokers. What should I know about preventing infection? Hepatitis B  If you have a higher risk for hepatitis B, you should be screened for this virus. Talk with your health care provider to find out if you are at risk for hepatitis B infection. Hepatitis C  Blood testing is recommended for:  Everyone born from [birth year] through 1963.  Anyone with known risk factors for hepatitis C. Sexually Transmitted Diseases (STDs)  You should be screened each year for STDs including gonorrhea and chlamydia if:  You are sexually active and are younger than 44 years of age.  You are older than 44 years of age and your health care provider tells you that you are at risk for this type of infection.  Your sexual activity has changed since you were last screened and you are at an increased risk for chlamydia or gonorrhea. Ask your health care provider if you are at risk.  Talk with your health care provider about whether you are at high risk of being infected with HIV. Your health care provider may recommend a prescription medicine to help prevent HIV infection. What else can I do?  Schedule regular health, dental, and eye exams.  Stay current with your vaccines (immunizations).  Do not use any tobacco products, such as cigarettes, chewing tobacco, and e-cigarettes. If you need help quitting, ask your health care provider.  Limit  alcohol intake to no more than 2 drinks per day. One drink equals 12 ounces of beer, 5 ounces of wine, or 1 ounces of hard liquor.  Do not use street drugs.  Do not share needles.  Ask your health care provider for help if you need support or information about quitting drugs.  Tell your health care provider if you often feel depressed.  Tell your health care provider if you have ever been abused or do not feel safe at home. This information is not intended to replace advice given to you by your health care provider. Make sure you discuss any questions you have with your health care provider. Document Released: 11/05/2007 Document Revised: 01/06/2016 Document Reviewed: 02/10/2015 Elsevier Interactive Patient Education  2017 [employer].

## 2016-09-23 NOTE — Assessment & Plan Note (Addendum)
Discussed MRI vs renal US to f/u cystic lesion of mid L kidney - pt requests MRI. Ordered today.  Update UA today.
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
## 2016-09-23 NOTE — Addendum Note (Signed)
Addended by: Dorothyann Gibbs on: 09/23/2016 12:13 PM   Modules accepted: Orders

## 2016-09-23 NOTE — Assessment & Plan Note (Signed)
Chronic, deterioration noted. Continue metformin 1500mg  XR daily. RTC 6 mo DM f/u visit.

## 2016-09-23 NOTE — Assessment & Plan Note (Signed)
Encouraged healthy diet and lifestyle choices to affect sustainable weight loss.  ?

## 2016-09-23 NOTE — Assessment & Plan Note (Signed)
Preventative protocols reviewed and updated unless pt declined. Discussed healthy diet and lifestyle.  

## 2016-10-07 ENCOUNTER — Other Ambulatory Visit: Payer: BLUE CROSS/BLUE SHIELD

## 2016-12-20 ENCOUNTER — Other Ambulatory Visit: Payer: Self-pay | Admitting: *Deleted

## 2016-12-20 MED ORDER — HYDROCHLOROTHIAZIDE 25 MG PO TABS: 25.0000 mg | ORAL_TABLET | Freq: Every day | ORAL | 3 refills | Status: DC

## 2016-12-20 MED ORDER — METFORMIN HCL ER 500 MG PO TB24: 1500.0000 mg | ORAL_TABLET | Freq: Every day | ORAL | 3 refills | Status: DC

## 2016-12-20 MED ORDER — LOSARTAN POTASSIUM 50 MG PO TABS: 50.0000 mg | ORAL_TABLET | Freq: Every day | ORAL | 3 refills | Status: DC

## 2017-02-10 IMAGING — US US ABDOMEN COMPLETE
1 series · 13 of 25 positions shown · non-contrast
Comparison: Abdominal ultrasound July 04, 2008

CLINICAL DATA: Follow-up of fatty liver disease and left renal
lesion

EXAM:
ABDOMEN ULTRASOUND COMPLETE

[Series 1: us abdomen complete · 0.22mm/px · 13 of 85 slices shown]
[im 1/85]
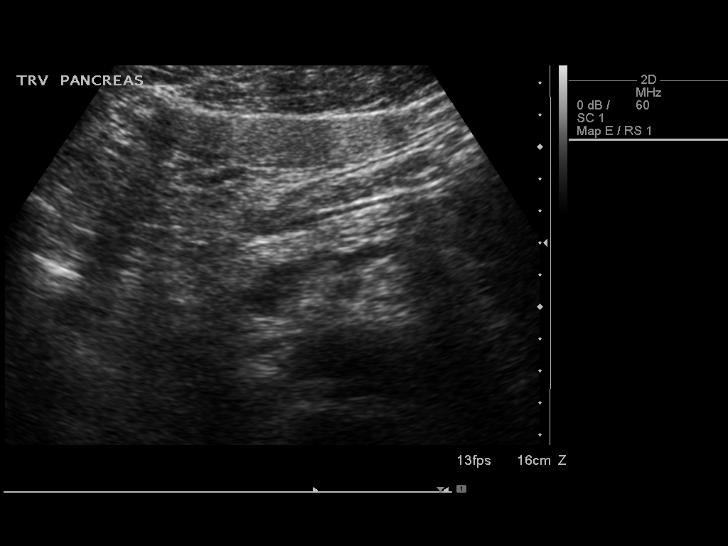
[im 8/85]
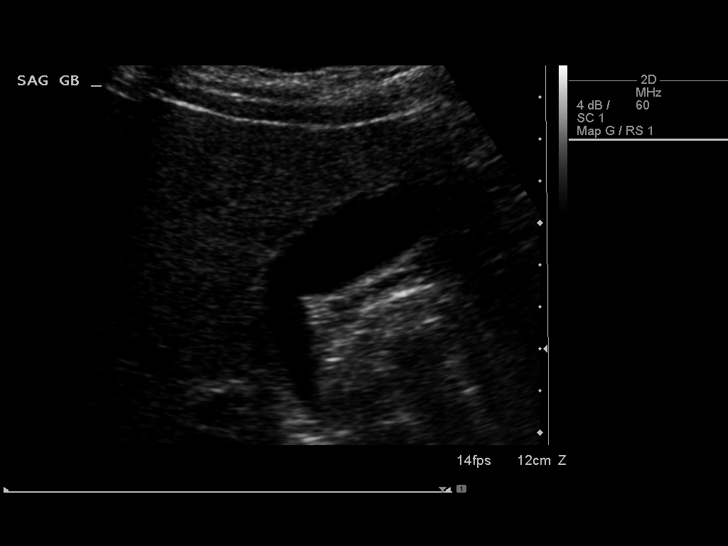
[im 15/85]
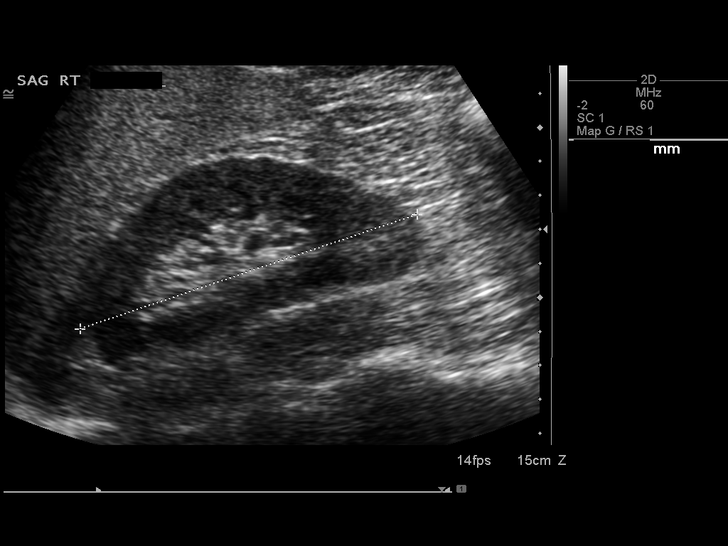
[im 22/85]
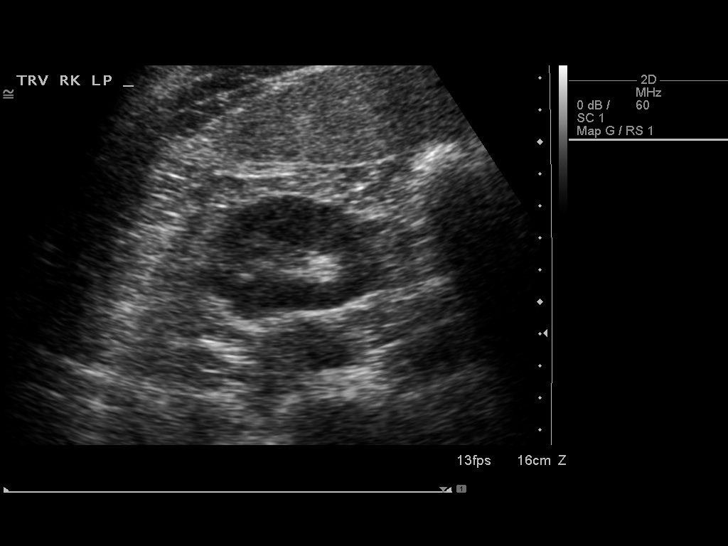
[im 29/85]
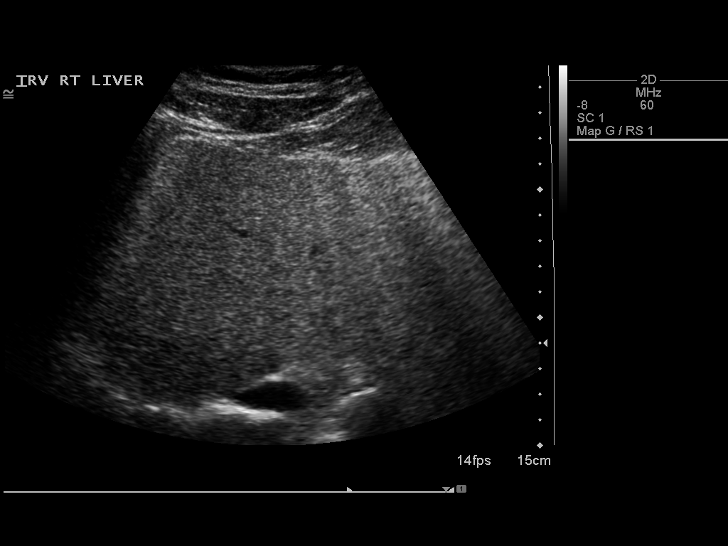
[im 36/85]
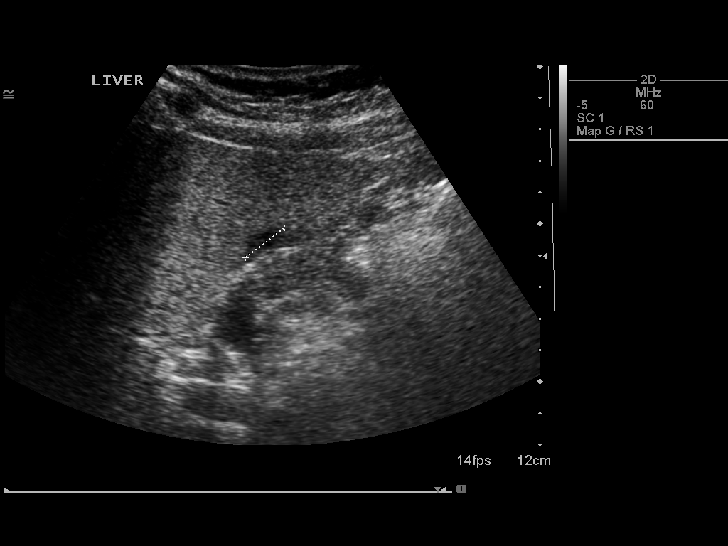
[im 43/85]
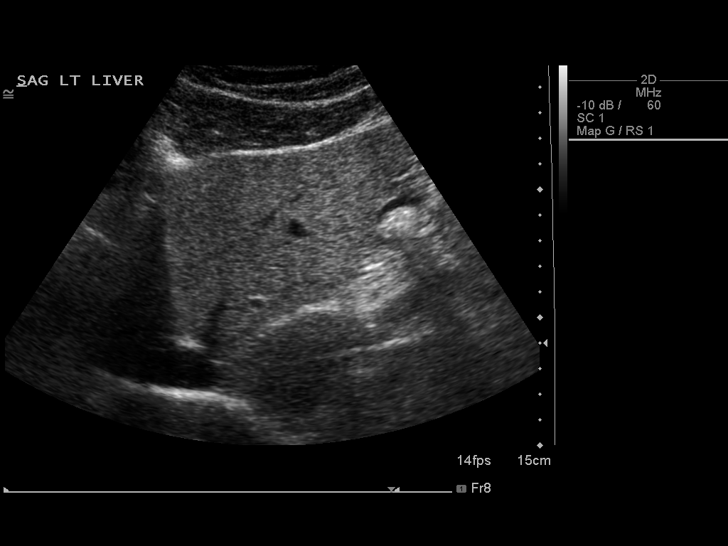
[im 50/85]
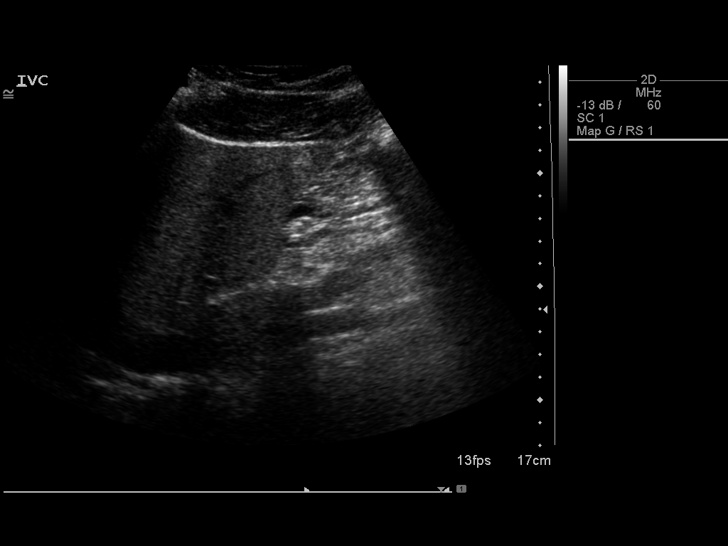
[im 57/85]
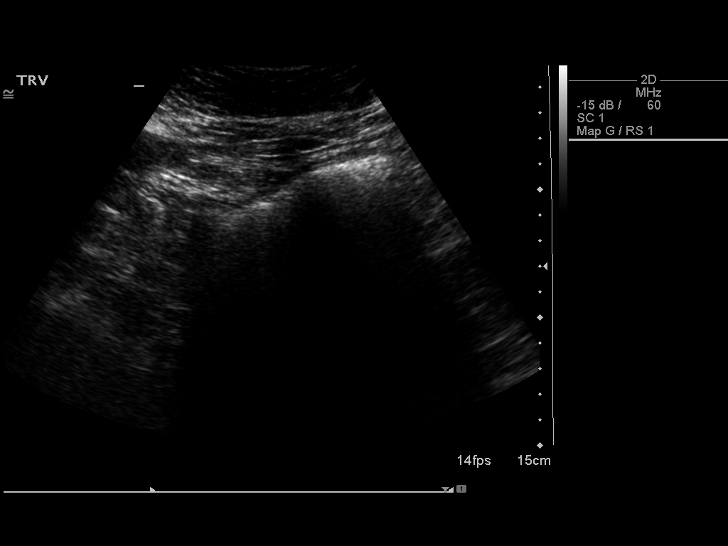
[im 64/85]
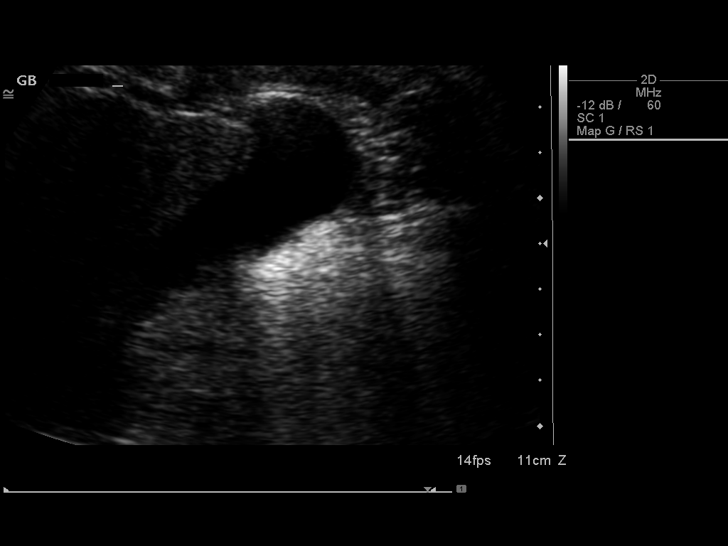
[im 71/85]
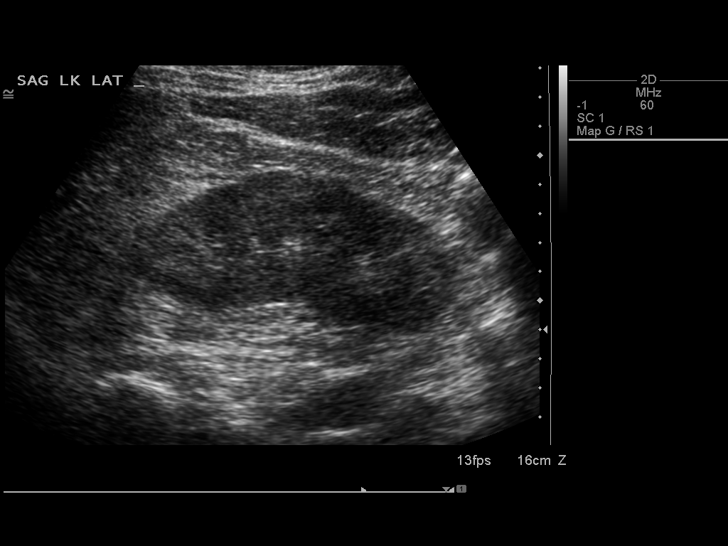
[im 78/85]
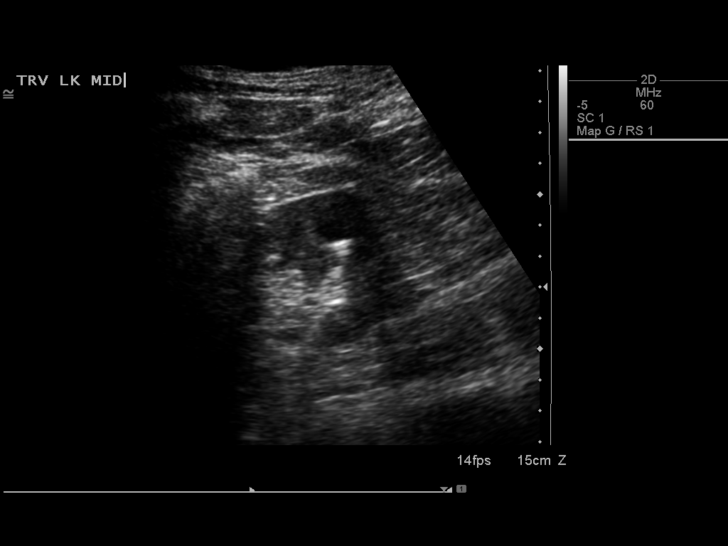
[im 85/85]
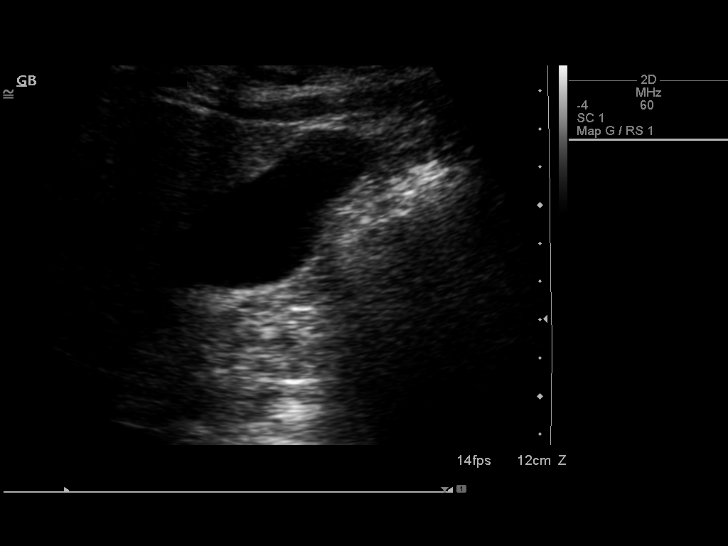

[13 of 25 positions shown; findings below may reference images not displayed]

FINDINGS: Gallbladder: No gallstones or wall thickening visualized. No
sonographic Murphy sign noted by sonographer.

Common bile duct: Diameter: 4.5 mm

Liver: The hepatic echotexture heterogeneous To increased. In the
right hepatic lobe adjacent to the gallbladder there is area of
decreased echogenicity which likely reflects focal fatty sparing.
There is no intrahepatic ductal dilation. The surface contour of the
liver appears smooth.

IVC: Bowel gas limits evaluation of the IVC.

Pancreas: Bowel gas limits evaluation of the pancreatic head and
tail. The visualized portions of the pancreatic body appear normal.

Spleen: Size and appearance within normal limits.

Right Kidney: Length: 10.5 cm. Echogenicity within normal limits. No
mass or hydronephrosis visualized.

Left Kidney: Length: 11.6 cm. In the midpole there is a cystic
lesion with echogenic mural focus and some distal shadowing. This
measures 1.4 x 1.2 x 1.3 cm. Elsewhere the renal cortical
echotexture is normal. There is no hydronephrosis.

Abdominal aorta: No aneurysm visualized. The iliac arteries were
obscured by bowel gas.

Other findings: None.
IMPRESSION: 1. Fatty infiltrative change of the liver. No discrete hepatic mass
or other acute abnormality is observed. The gallbladder and
visualized portions of the pancreas are unremarkable.
2. In the midpole of the left kidney the cystic appearing lesion is
more conspicuous today while the mural calcification appears less
conspicuous. Overall the size of the lesion is not appreciably
changed measuring 1.4 cm in greatest dimension. For better tissue
characterization of this left renal lesion, renal protocol MRI would
be useful.

## 2017-03-26 ENCOUNTER — Other Ambulatory Visit: Payer: Self-pay | Admitting: Family Medicine

## 2017-03-26 DIAGNOSIS — K760 Fatty (change of) liver, not elsewhere classified: Secondary | ICD-10-CM

## 2017-03-26 DIAGNOSIS — E785 Hyperlipidemia, unspecified: Secondary | ICD-10-CM

## 2017-03-26 DIAGNOSIS — E119 Type 2 diabetes mellitus without complications: Secondary | ICD-10-CM

## 2017-03-29 ENCOUNTER — Other Ambulatory Visit (INDEPENDENT_AMBULATORY_CARE_PROVIDER_SITE_OTHER): Payer: BLUE CROSS/BLUE SHIELD

## 2017-03-29 DIAGNOSIS — E119 Type 2 diabetes mellitus without complications: Secondary | ICD-10-CM | POA: Diagnosis not present

## 2017-03-29 DIAGNOSIS — K760 Fatty (change of) liver, not elsewhere classified: Secondary | ICD-10-CM

## 2017-03-29 DIAGNOSIS — E785 Hyperlipidemia, unspecified: Secondary | ICD-10-CM | POA: Diagnosis not present

## 2017-03-29 LAB — LIPID PANEL
CHOLESTEROL: 218 mg/dL — AB (ref 0–200)
HDL: 30.7 mg/dL — ABNORMAL LOW (ref >39.00)
LDL Cholesterol: 150 mg/dL — ABNORMAL HIGH (ref 0–99)
NonHDL: 187.3
TRIGLYCERIDES: 186 mg/dL — AB (ref 0.0–149.0)
Total CHOL/HDL Ratio: 7
VLDL: 37.2 mg/dL (ref 0.0–40.0)

## 2017-03-29 LAB — COMPREHENSIVE METABOLIC PANEL
ALBUMIN: 4.3 g/dL (ref 3.5–5.2)
ALK PHOS: 70 U/L (ref 39–117)
ALT: 125 U/L — AB (ref 0–53)
AST: 101 U/L — AB (ref 0–37)
BILIRUBIN TOTAL: 1.4 mg/dL — AB (ref 0.2–1.2)
BUN: 14 mg/dL (ref 6–23)
CALCIUM: 9.9 mg/dL (ref 8.4–10.5)
CO2: 33 mEq/L — ABNORMAL HIGH (ref 19–32)
CREATININE: 1.38 mg/dL (ref 0.40–1.50)
Chloride: 103 mEq/L (ref 96–112)
GFR: 59.38 mL/min — ABNORMAL LOW (ref >60.00)
Glucose, Bld: 165 mg/dL — ABNORMAL HIGH (ref 70–99)
Potassium: 4 mEq/L (ref 3.5–5.1)
Sodium: 142 mEq/L (ref 135–145)
Total Protein: 7.4 g/dL (ref 6.0–8.3)

## 2017-03-29 LAB — IBC PANEL
Iron: 92 ug/dL (ref 42–165)
Saturation Ratios: 27.3 % (ref 20.0–50.0)
TRANSFERRIN: 241 mg/dL (ref 212.0–360.0)

## 2017-03-29 LAB — HEMOGLOBIN A1C: Hgb A1c MFr Bld: 8.2 % — ABNORMAL HIGH (ref 4.6–6.5)

## 2017-03-31 ENCOUNTER — Ambulatory Visit: Payer: BLUE CROSS/BLUE SHIELD | Admitting: Family Medicine

## 2017-04-07 ENCOUNTER — Telehealth: Payer: Self-pay | Admitting: Family Medicine

## 2017-04-07 ENCOUNTER — Ambulatory Visit: Payer: BLUE CROSS/BLUE SHIELD | Admitting: Family Medicine

## 2017-04-07 ENCOUNTER — Encounter: Payer: Self-pay | Admitting: Family Medicine

## 2017-04-07 VITALS — BP 120/80 | HR 80 | Temp 98.0°F | Wt 232.0 lb

## 2017-04-07 DIAGNOSIS — N281 Cyst of kidney, acquired: Secondary | ICD-10-CM | POA: Diagnosis not present

## 2017-04-07 DIAGNOSIS — K760 Fatty (change of) liver, not elsewhere classified: Secondary | ICD-10-CM | POA: Diagnosis not present

## 2017-04-07 DIAGNOSIS — E1165 Type 2 diabetes mellitus with hyperglycemia: Secondary | ICD-10-CM | POA: Diagnosis not present

## 2017-04-07 DIAGNOSIS — N529 Male erectile dysfunction, unspecified: Secondary | ICD-10-CM | POA: Diagnosis not present

## 2017-04-07 DIAGNOSIS — I10 Essential (primary) hypertension: Secondary | ICD-10-CM

## 2017-04-07 DIAGNOSIS — E785 Hyperlipidemia, unspecified: Secondary | ICD-10-CM | POA: Diagnosis not present

## 2017-04-07 MED ORDER — METFORMIN HCL ER (MOD) 1000 MG PO TB24: 2000.0000 mg | ORAL_TABLET | Freq: Every day | ORAL | 3 refills | Status: DC

## 2017-04-07 MED ORDER — METFORMIN HCL ER 500 MG PO TB24: 1000.0000 mg | ORAL_TABLET | Freq: Two times a day (BID) | ORAL | 3 refills | Status: DC

## 2017-04-07 MED ORDER — METFORMIN HCL ER (OSM) 1000 MG PO TB24: 1000.0000 mg | ORAL_TABLET | Freq: Every day | ORAL | 3 refills | Status: DC

## 2017-04-07 MED ORDER — ROSUVASTATIN CALCIUM 20 MG PO TABS: 20.0000 mg | ORAL_TABLET | Freq: Every day | ORAL | 6 refills | Status: DC

## 2017-04-07 MED ORDER — SILDENAFIL CITRATE 50 MG PO TABS: 50.0000 mg | ORAL_TABLET | Freq: Every day | ORAL | 1 refills | Status: AC | PRN

## 2017-04-07 NOTE — Progress Notes (Signed)
BP 120/80 (BP Location: Right Arm, Cuff Size: Large)   Pulse 80   Temp 98 F (36.7 C) (Oral)   Wt 232 lb (105.2 kg)   SpO2 92%   BMI 33.29 kg/m    CC: 6 mo f/u visit Subjective:    Patient ID: Michael Mcconnell, male    DOB: [birth year], 46 y.o.   MRN: [id number]  HPI: [name] is a 46 y.o. male presenting on 04/07/2017 for 6 mo follow-up   Never completed MRI for 1.4cm L kidney cystic lesion with mural calcifications - too expensive $800. Agrees to rpt [place].   HLD - compliant with lipitor. Has noticed increasing cramping and muscle soreness when working out.   DM - does not regularly check sugars. Compliant with antihyperglycemic regimen which includes: metformin XR 1500mg  daily.  Tries watching portion sizes - smaller portions at dinner. Denies low sugars or hypoglycemic symptoms. Denies paresthesias. Last diabetic eye exam DUE. Pneumovax: 12/2015. Prevnar: not due. Glucometer brand: one-touch. DSME: has not completed. 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  Over last 2 months noticing drawing of penis with some erectile dysfunction with trouble obtaining erection. Does not wake up with erection. Denies dysuria or urethral discharge  Relevant past medical, surgical, family and social history reviewed and updated as indicated. Interim medical history since our last visit reviewed. Allergies and  medications reviewed and updated. Outpatient Medications Prior to Visit  Medication Sig Dispense Refill  . glucose blood (ONE TOUCH ULTRA TEST) test strip Once daily and as needed 100 each 3  . hydrochlorothiazide (HYDRODIURIL) 25 MG tablet Take 1 tablet (25 mg total) by mouth daily. 90 tablet 3  . ibuprofen (ADVIL,MOTRIN) 600 MG tablet Take 1 tablet (600 mg total) by mouth every 8 (eight) hours as needed. 30 tablet 0  . losartan (COZAAR) 50 MG tablet Take 1 tablet (50 mg total) by mouth daily. 90 tablet 3  . Multiple Vitamins-Minerals (MULTIVITAMIN PO) Take 1 capsule by mouth daily.    Marland Kitchen atorvastatin (LIPITOR) 40 MG tablet Take 1 tablet (40 mg total) by mouth daily at 6 PM. 90 tablet 3  . hydrocortisone (ANUSOL-HC) 25 MG suppository Place 1 suppository (25 mg total) rectally 2 (two) times daily. 12 suppository 0  . metFORMIN (GLUCOPHAGE-XR) 500 MG 24 hr tablet Take 3 tablets (1,500 mg total) by mouth daily with breakfast. 270 tablet 3   No facility-administered medications prior to visit.      Per HPI unless specifically indicated in ROS section below Review of Systems     Objective:    BP 120/80 (BP Location: Right Arm, Cuff Size: Large)   Pulse 80   Temp 98 F (36.7 C) (Oral)   Wt 232 lb (105.2 kg)   SpO2 92%   BMI 33.29 kg/m   Wt Readings from Last 3 Encounters:  04/07/17 232 lb (105.2 kg)  09/23/16 224  lb 12.8 oz (102 kg)  01/18/16 222 lb 4 oz (100.8 kg)    Physical Exam  Constitutional: He appears well-developed and well-nourished. No distress.  HENT:  Head: Normocephalic and atraumatic.  Right Ear: External ear normal.  Left Ear: External ear normal.  Nose: Nose normal.  Mouth/Throat: Oropharynx is clear and moist. No oropharyngeal exudate.  Eyes: Conjunctivae and EOM are normal. Pupils are equal, round, and reactive to light. No scleral icterus.  Neck: Normal range of motion. Neck supple.  Cardiovascular: Normal rate, regular rhythm, normal heart sounds and intact  distal pulses.  No murmur heard. Pulmonary/Chest: Effort normal and breath sounds normal. No respiratory distress. He has no wheezes. He has no rales.  Genitourinary: Penis normal. Right testis shows no mass, no swelling and no tenderness. Right testis is descended. Left testis shows mass and swelling. Left testis shows no tenderness. Left testis is descended. Circumcised. No phimosis, hypospadias, penile erythema or penile tenderness.  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  See HPI for foot exam if done  Lymphadenopathy:    He has no cervical adenopathy.  Skin: Skin is warm and dry. No rash noted.  Psychiatric: He has a normal mood and affect.  Nursing note and vitals reviewed.  Results for orders placed or performed in visit on 03/29/17  IBC panel  Result Value Ref Range   Iron 92 42 - 165 ug/dL   Transferrin 241.0 212.0 - 360.0 mg/dL   Saturation Ratios 27.3 20.0 - 50.0 %  Hemoglobin A1c  Result Value Ref Range   Hgb A1c MFr Bld 8.2 (H) 4.6 - 6.5 %  Comprehensive metabolic panel  Result Value Ref Range   Sodium 142 135 - 145 mEq/L   Potassium 4.0 3.5 - 5.1 mEq/L   Chloride 103 96 - 112 mEq/L   CO2 33 (H) 19 - 32 mEq/L   Glucose, Bld 165 (H) 70 - 99 mg/dL   BUN 14 6 - 23 mg/dL   Creatinine, Ser 1.38 0.40 - 1.50 mg/dL   Total Bilirubin 1.4 (H) 0.2 - 1.2 mg/dL   Alkaline Phosphatase 70 39 - 117 U/L   AST 101 (H) 0 - 37 U/L   ALT 125 (H) 0 - 53 U/L   Total Protein 7.4 6.0 - 8.3 g/dL   Albumin 4.3 3.5 - 5.2 g/dL   Calcium 9.9 8.4 - 10.5 mg/dL   GFR 59.38 (L) >60.00 mL/min  Lipid panel  Result Value Ref Range   Cholesterol 218 (H) 0 - 200 mg/dL   Triglycerides 186.0 (H) 0.0 - 149.0 mg/dL   HDL 30.70 (L) >39.00 mg/dL   VLDL 37.2 0.0 - 40.0 mg/dL   LDL Cholesterol 150 (H) 0 - 99 mg/dL   Total CHOL/HDL Ratio 7    NonHDL 187.30       Assessment &  Plan:   Problem List Items Addressed This Visit    Cyst of left kidney    MRI was too expensive. Will order renal US.       Relevant Orders   US Renal   Diabetes type 2, uncontrolled ([place]) - Primary    Chronic, again deteriorated. Will refer to DSME. Increase metformin to 2000 mg XR daily. Encouraged low carb diet. RTC 3 mo f/u visit.       Relevant Medications   rosuvastatin (CRESTOR) 20 MG tablet   metformin (FORTAMET) 1000 MG (OSM) 24  hr tablet   Other Relevant Orders   Ambulatory referral to diabetic education   Dyslipidemia    Chronic. Noticing worsening myalgias when working out. Will trial crestor 20mg  in place of lipitor 40mg        Relevant Medications   rosuvastatin (CRESTOR) 20 MG tablet   Erectile dysfunction    Anticipate organic ED. Unclear cause of "drawing" sensation. Trial viagra 50mg  daily. Discussed side effects and adverse events to monitor including HA, priapism, chest pain. Pt aware must avoid nitrates.       Hypertension    Chronic, stable. Continue current regimen.       Relevant Medications   rosuvastatin (CRESTOR) 20 MG tablet   sildenafil (VIAGRA) 50 MG tablet   NAFLD (nonalcoholic fatty liver disease)    Anticipate NASH. Unclear if he's had viral hepatitis panel in the past.           Follow up plan: Return in about 3 months (around 07/08/2017) for follow up visit.  Ria Bush, MD

## 2017-04-07 NOTE — Telephone Encounter (Signed)
Ok let's do generic metformin ER 500mg  2 tablets twice daily.  plz verify correct metformin on med list.

## 2017-04-07 NOTE — Telephone Encounter (Signed)
[facility] called to say that they can fill with a generic version of Metformin. Insurance will not pay for the [facility] as written but will pay for a generic: Metformin ER but pt will have to take 2- 500 mg tablets. Is it ok to fill. If so [facility] can be reached at [phone]  Reference number: [id number]

## 2017-04-07 NOTE — Patient Instructions (Addendum)
Increase metformin to 2000 mg daily (4 pills). Change lipitor to crestor - new medicine at pharmacy. Work on low cholesterol and low carb diet.  Trial viagra sent to pharmacy.  We will order kidney ultrasound.  We will [place] up for [place] classes.  Return in 3 months for follow up diabetes.   Diabetes Mellitus and Food It is important for you to manage your blood sugar (glucose) level. Your blood glucose level can be greatly affected by what you eat. Eating healthier foods in the appropriate amounts throughout the day at about the same time each day will help you control your blood glucose level. It can also help slow or prevent worsening of your diabetes mellitus. Healthy eating may even help you improve the level of your blood pressure and reach or maintain a healthy weight. General recommendations for healthful eating and cooking habits include:  Eating meals and snacks regularly. Avoid going long periods of time without eating to lose weight.  Eating a diet that consists mainly of plant-based foods, such as fruits, vegetables, nuts, legumes, and whole grains.  Using low-heat cooking methods, such as baking, instead of high-heat cooking methods, such as deep frying.  Work with your dietitian to make sure you understand how to use the Nutrition Facts information on food labels. How can food affect me? Carbohydrates Carbohydrates affect your blood glucose level more than any other type of food. Your dietitian will help you determine how many carbohydrates to eat at each meal and teach you how to count carbohydrates. Counting carbohydrates is important to keep your blood glucose at a healthy level, especially if you are using insulin or taking certain medicines for diabetes mellitus. Alcohol Alcohol can cause sudden decreases in blood glucose (hypoglycemia), especially if you use insulin or take certain medicines for diabetes mellitus. Hypoglycemia can be a life-threatening  condition. Symptoms of hypoglycemia (sleepiness, dizziness, and disorientation) are similar to symptoms of having too much alcohol. If your health care provider has given you approval to drink alcohol, do so in moderation and use the following guidelines:  Women should not have more than one drink per day, and men should not have more than two drinks per day. One drink is equal to: ? 12 oz of beer. ? 5 oz of wine. ? 1 oz of hard liquor.  Do not drink on an empty stomach.  Keep yourself hydrated. Have water, diet soda, or unsweetened iced tea.  Regular soda, juice, and other mixers might contain a lot of carbohydrates and should be counted.  What foods are not recommended? As you make food choices, it is important to remember that all foods are not the same. Some foods have fewer nutrients per serving than other foods, even though they might have the same number of calories or carbohydrates. It is difficult to get your body what it needs when you eat foods with fewer nutrients. Examples of foods that you should avoid that are high in calories and carbohydrates but low in nutrients include:  Trans fats (most processed foods list trans fats on the Nutrition Facts label).  Regular soda.  Juice.  Candy.  Sweets, such as cake, pie, doughnuts, and cookies.  Fried foods.  What foods can I eat? Eat nutrient-rich foods, which will nourish your body and keep you healthy. The food you should eat also will depend on several factors, including:  The calories you need.  The medicines you take.  Your weight.  Your blood glucose level.  Your blood pressure level.  Your cholesterol level.  You should eat a variety of foods, including:  Protein. ? Lean cuts of meat. ? Proteins low in saturated fats, such as fish, egg whites, and beans. Avoid processed meats.  Fruits and vegetables. ? Fruits and vegetables that may help control blood glucose levels, such as apples, mangoes, and  yams.  Dairy products. ? Choose fat-free or low-fat dairy products, such as milk, yogurt, and cheese.  Grains, bread, pasta, and rice. ? Choose whole grain products, such as multigrain bread, whole oats, and brown rice. These foods may help control blood pressure.  Fats. ? Foods containing healthful fats, such as nuts, avocado, olive oil, canola oil, and fish.  Does everyone with diabetes mellitus have the same meal plan? Because every person with diabetes mellitus is different, there is not one meal plan that works for everyone. It is very important that you meet with a dietitian who will help you create a meal plan that is just right for you. This information is not intended to replace advice given to you by your health care provider. Make sure you discuss any questions you have with your health care provider. Document Released: 02/03/2005 Document Revised: 10/15/2015 Document Reviewed: 04/05/2013 Elsevier Interactive Patient Education  2017 [employer].

## 2017-04-07 NOTE — Telephone Encounter (Addendum)
Spoke with [facility] relaying message per Dr. Oneita Jolly they will finish processing for pt and get it sent out.    Rx is correct in med list.

## 2017-04-07 NOTE — Assessment & Plan Note (Signed)
Chronic. Noticing worsening myalgias when working out. Will trial crestor 20mg  in place of lipitor 40mg 

## 2017-04-07 NOTE — Assessment & Plan Note (Signed)
Chronic, again deteriorated. Will refer to DSME. Increase metformin to 2000 mg XR daily. Encouraged low carb diet. RTC 3 mo f/u visit.

## 2017-04-07 NOTE — Assessment & Plan Note (Signed)
Anticipate organic ED. Unclear cause of "drawing" sensation. Trial viagra 50mg  daily. Discussed side effects and adverse events to monitor including HA, priapism, chest pain. Pt aware must avoid nitrates.

## 2017-04-07 NOTE — Assessment & Plan Note (Signed)
Anticipate NASH. Unclear if he's had viral hepatitis panel in the past.

## 2017-04-07 NOTE — Assessment & Plan Note (Signed)
Chronic, stable. Continue current regimen. 

## 2017-04-07 NOTE — Assessment & Plan Note (Signed)
MRI was too expensive. Will order renal US.

## 2017-05-06 LAB — HM DIABETES EYE EXAM

## 2017-06-23 ENCOUNTER — Ambulatory Visit
Admission: RE | Admit: 2017-06-23 | Discharge: 2017-06-23 | Disposition: A | Discharge status: Home / Self Care | Payer: BLUE CROSS/BLUE SHIELD | Source: Ambulatory Visit | Attending: Family Medicine | Admitting: Family Medicine

## 2017-06-23 DIAGNOSIS — N281 Cyst of kidney, acquired: Secondary | ICD-10-CM | POA: Diagnosis not present

## 2017-06-29 ENCOUNTER — Encounter: Payer: BLUE CROSS/BLUE SHIELD | Attending: Family Medicine | Admitting: Dietician

## 2017-06-29 ENCOUNTER — Encounter: Payer: Self-pay | Admitting: Dietician

## 2017-06-29 DIAGNOSIS — E1165 Type 2 diabetes mellitus with hyperglycemia: Secondary | ICD-10-CM | POA: Insufficient documentation

## 2017-06-29 DIAGNOSIS — Z713 Dietary counseling and surveillance: Secondary | ICD-10-CM | POA: Diagnosis not present

## 2017-06-29 NOTE — Progress Notes (Signed)
Diabetes Self-Management Education  Visit Type: First/Initial  Appt. Start Time: 1530 Appt. End Time: 1630  06/29/2017  Michael Mcconnell, identified by name and date of birth, is a 47 y.o. male with a diagnosis of Diabetes: Type 2.   ASSESSMENT  Diabetes Self-Management Education - 06/29/17 1542      Visit Information   Visit Type  First/Initial      Initial Visit   Diabetes Type  Type 2    Are you currently following a meal plan?  No    Are you taking your medications as prescribed?  Yes    Date Diagnosed  2017      Health Coping   How would you rate your overall health?  Fair      Psychosocial Assessment   Patient Belief/Attitude about Diabetes  Motivated to manage diabetes    How often do you need to have someone help you when you read instructions, pamphlets, or other written materials from your doctor or pharmacy?  1 - Never    What is the last grade level you completed in school?  college grad      Complications   Last HgB A1C per patient/outside source  8.2 %  (Pended)     How often do you check your blood sugar?  0 times/day (not testing)  (Pended)     Have you had a dilated eye exam in the past 12 months?  Yes  (Pended)     Are you checking your feet?  Yes  (Pended)     How many days per week are you checking your feet?  2  (Pended)       Dietary Intake   Breakfast  skips during weekdays, will have breakfast on weekends  (Pended)     Snack (morning)  8-9 am - banana or apple, crackers or peanuts or pretzels or cheese, coffee with creamer  (Pended)     Lunch  1 pm - leftovers from night before, take-out  (Pended)     Snack (afternoon)  usually none  (Pended)     Dinner  5-6 pm - fish like salmon tilapia 2x/week, [employer] or [employer] 2-3x/week, hamburger helper or spaghetti or beef tips and rice with carrots  (Pended)     Snack (evening)  sometimes Lance crackers or cheez-its  (Pended)     Beverage(s)  water, coffee, occassionally minute maid orange juice or lemonade  or lactaid milk   (Pended)       Exercise   Exercise Type  Moderate (swimming / aerobic walking)  (Pended)     How many days per week to you exercise?  4  (Pended)     How many minutes per day do you exercise?  60  (Pended)     Total minutes per week of exercise  240  (Pended)       Patient Education   Previous Diabetes Education  No  (Pended)     Nutrition management   Role of diet in the treatment of diabetes and the relationship between the three main macronutrients and blood glucose level;Food label reading, portion sizes and measuring food.;Carbohydrate counting;Information on hints to eating out and maintain blood glucose control.  (Pended)     Physical activity and exercise   Role of exercise on diabetes management, blood pressure control and cardiac health.  (Pended)       Individualized Goals (developed by patient)   Nutrition  Follow meal plan discussed;General guidelines for healthy choices and  portions discussed  (Pended)     Physical Activity  Exercise 3-5 times per week;30 minutes per day  (Pended)       Outcomes   Expected Outcomes  Demonstrated interest in learning. Expect positive outcomes  (Pended)     Future DMSE  PRN  (Pended)     Program Status  Completed  (Pended)        Individualized Plan for Diabetes Self-Management Training:   Learning Objective:  Patient will have a greater understanding of diabetes self-management. Patient education plan is to attend individual and/or group sessions per assessed needs and concerns.   Plan:   Follow Diabetes Meal Plan as instructed  Use the Diabetes Portion Plate to create or choose healthy, balanced meals  Eat 3 meals and 2-3 snacks, every 3-5 hrs  Limit carbohydrate intake to 45-60 grams carbohydrate/meal  Limit carbohydrate intake to 15-30 grams carbohydrate/snack  Add lean protein foods to meals/snacks  Aim for 30 mins of physical activity daily   Expected Outcomes:  (P) Demonstrated interest in learning.  Expect positive outcomes  Education material provided: Meal plan card, My Plate and Snack sheet  If problems or questions, patient to contact team via:  Phone and Email  Future DSME appointment: (P) PRN

## 2017-07-07 ENCOUNTER — Encounter: Payer: Self-pay | Admitting: Family Medicine

## 2017-07-07 ENCOUNTER — Ambulatory Visit: Payer: BLUE CROSS/BLUE SHIELD | Admitting: Family Medicine

## 2017-07-07 VITALS — BP 118/78 | HR 77 | Temp 98.4°F | Wt 232.0 lb

## 2017-07-07 DIAGNOSIS — E1165 Type 2 diabetes mellitus with hyperglycemia: Secondary | ICD-10-CM | POA: Diagnosis not present

## 2017-07-07 DIAGNOSIS — I10 Essential (primary) hypertension: Secondary | ICD-10-CM

## 2017-07-07 DIAGNOSIS — K760 Fatty (change of) liver, not elsewhere classified: Secondary | ICD-10-CM | POA: Diagnosis not present

## 2017-07-07 DIAGNOSIS — N281 Cyst of kidney, acquired: Secondary | ICD-10-CM

## 2017-07-07 DIAGNOSIS — E785 Hyperlipidemia, unspecified: Secondary | ICD-10-CM | POA: Diagnosis not present

## 2017-07-07 DIAGNOSIS — E669 Obesity, unspecified: Secondary | ICD-10-CM | POA: Diagnosis not present

## 2017-07-07 LAB — COMPREHENSIVE METABOLIC PANEL
ALBUMIN: 4.7 g/dL (ref 3.5–5.2)
ALT: 83 U/L — ABNORMAL HIGH (ref 0–53)
AST: 70 U/L — ABNORMAL HIGH (ref 0–37)
Alkaline Phosphatase: 73 U/L (ref 39–117)
BUN: 18 mg/dL (ref 6–23)
CHLORIDE: 102 meq/L (ref 96–112)
CO2: 32 mEq/L (ref 19–32)
Calcium: 9.5 mg/dL (ref 8.4–10.5)
Creatinine, Ser: 1.26 mg/dL (ref 0.40–1.50)
GFR: 65.87 mL/min (ref >60.00)
Glucose, Bld: 156 mg/dL — ABNORMAL HIGH (ref 70–99)
POTASSIUM: 4.2 meq/L (ref 3.5–5.1)
SODIUM: 141 meq/L (ref 135–145)
Total Bilirubin: 1.5 mg/dL — ABNORMAL HIGH (ref 0.2–1.2)
Total Protein: 7.7 g/dL (ref 6.0–8.3)

## 2017-07-07 LAB — LIPID PANEL
CHOLESTEROL: 120 mg/dL (ref 0–200)
HDL: 33.2 mg/dL — AB (ref >39.00)
LDL CALC: 58 mg/dL (ref 0–99)
NonHDL: 87
Total CHOL/HDL Ratio: 4
Triglycerides: 144 mg/dL (ref 0.0–149.0)
VLDL: 28.8 mg/dL (ref 0.0–40.0)

## 2017-07-07 LAB — HEMOGLOBIN A1C: HEMOGLOBIN A1C: 7.6 % — AB (ref 4.6–6.5)

## 2017-07-07 MED ORDER — ROSUVASTATIN CALCIUM 20 MG PO TABS: 20.0000 mg | ORAL_TABLET | Freq: Every day | ORAL | 3 refills | Status: DC

## 2017-07-07 MED ORDER — GLUCOSE BLOOD VI STRP: ORAL_STRIP | 3 refills | Status: DC

## 2017-07-07 MED ORDER — ONETOUCH ULTRA MINI W/DEVICE KIT: PACK | 0 refills | Status: AC

## 2017-07-07 NOTE — Assessment & Plan Note (Addendum)
Chronic, stable on crestor - tolerating better than lipitor (myalgias). Update FLP today.

## 2017-07-07 NOTE — Assessment & Plan Note (Signed)
Reassuring benign rpt renal US.

## 2017-07-07 NOTE — Assessment & Plan Note (Signed)
Motivated for healthy diet and lifestyle changes for sustainable weight loss. Planning on starting exercise regimen with son.

## 2017-07-07 NOTE — Assessment & Plan Note (Signed)
Update LFT's 

## 2017-07-07 NOTE — Assessment & Plan Note (Signed)
Marked improvement in cbg's. Anticipate good control - check A1c. He has attended DSME nutritionist visit this month. Motivated for healthy changes.

## 2017-07-07 NOTE — Progress Notes (Signed)
BP 118/78 (BP Location: Left Arm, Patient Position: Sitting, Cuff Size: Large)   Pulse 77   Temp 98.4 F (36.9 C) (Oral)   Wt 232 lb (105.2 kg)   SpO2 95%   BMI 33.29 kg/m    CC: 3 mo f/u visit Subjective:    Patient ID: Michael Mcconnell, male    DOB: 18-Jul-1970, 47 y.o.   MRN: [id number]  HPI: Michael Mcconnell is a 47 y.o. male presenting on 07/07/2017 for 3 mo follow-up (Pt requests 30 day rx for Crestor sent to [facility] and rx for new One Touch Ultra meter.  Also, needs 90 day rx for Crestor sent to [facility].)   DM - does regularly check sugars once daily fasting - 86. Compliant with antihyperglycemic regimen which includes: metformin 582m 2 tab bid. Denies low sugars or hypoglycemic symptoms. Denies paresthesias. Last diabetic eye exam 04/2017. Pneumovax: 2017. Prevnar: not due. Glucometer brand: one touch. DSME: 06/2017. Lab Results  Component Value Date   HGBA1C 8.2 (H) 03/29/2017   Diabetic Foot Exam - Simple   No data filed     Lab Results  Component Value Date   MICROALBUR 5.2 (H) 09/11/2015     HLD - tolerating crestor without myalgias.   Relevant past medical, surgical, family and social history reviewed and updated as indicated. Interim medical history since our last visit reviewed. Allergies and medications reviewed and updated. Outpatient Medications Prior to Visit  Medication Sig Dispense Refill  . hydrochlorothiazide (HYDRODIURIL) 25 MG tablet Take 1 tablet (25 mg total) by mouth daily. 90 tablet 3  . ibuprofen (ADVIL,MOTRIN) 600 MG tablet Take 1 tablet (600 mg total) by mouth every 8 (eight) hours as needed. 30 tablet 0  . losartan (COZAAR) 50 MG tablet Take 1 tablet (50 mg total) by mouth daily. 90 tablet 3  . metFORMIN (GLUCOPHAGE-XR) 500 MG 24 hr tablet Take 2 tablets (1,000 mg total) 2 (two) times daily by mouth. 360 tablet 3  . Multiple Vitamins-Minerals (MULTIVITAMIN PO) Take 1 capsule by mouth daily.    . sildenafil (VIAGRA) 50 MG tablet  Take 1 tablet (50 mg total) daily as needed by mouth for erectile dysfunction. 10 tablet 1  . glucose blood (ONE TOUCH ULTRA TEST) test strip Once daily and as needed 100 each 3  . rosuvastatin (CRESTOR) 20 MG tablet Take 1 tablet (20 mg total) daily by mouth. 30 tablet 6   No facility-administered medications prior to visit.      Per HPI unless specifically indicated in ROS section below Review of Systems     Objective:    BP 118/78 (BP Location: Left Arm, Patient Position: Sitting, Cuff Size: Large)   Pulse 77   Temp 98.4 F (36.9 C) (Oral)   Wt 232 lb (105.2 kg)   SpO2 95%   BMI 33.29 kg/m   Wt Readings from Last 3 Encounters:  07/07/17 232 lb (105.2 kg)  04/07/17 232 lb (105.2 kg)  09/23/16 224 lb 12.8 oz (102 kg)    Physical Exam  Constitutional: He appears well-developed and well-nourished. No distress.  HENT:  Head: Normocephalic and atraumatic.  Right Ear: External ear normal.  Left Ear: External ear normal.  Nose: Nose normal.  Mouth/Throat: Oropharynx is clear and moist. No oropharyngeal exudate.  Eyes: Conjunctivae and EOM are normal. Pupils are equal, round, and reactive to light. No scleral icterus.  Neck: Normal range of motion. Neck supple.  Cardiovascular: Normal rate, regular rhythm, normal heart sounds  and intact distal pulses.  No murmur heard. Pulmonary/Chest: Effort normal and breath sounds normal. No respiratory distress. He has no wheezes. He has no rales.  Musculoskeletal: He exhibits no edema.  See HPI for foot exam if done  Lymphadenopathy:    He has no cervical adenopathy.  Skin: Skin is warm and dry. No rash noted.  Psychiatric: He has a normal mood and affect.  Nursing note and vitals reviewed.  Results for orders placed or performed in visit on 07/07/17  HM DIABETES EYE EXAM  Result Value Ref Range   HM Diabetic Eye Exam No Retinopathy No Retinopathy      Assessment & Plan:   Problem List Items Addressed This Visit    Cyst of left  kidney    Reassuring benign rpt renal US.      Diabetes type 2, uncontrolled ([place]) - Primary    Marked improvement in cbg's. Anticipate good control - check A1c. He has attended DSME nutritionist visit this month. Motivated for healthy changes.       Relevant Medications   rosuvastatin (CRESTOR) 20 MG tablet   Other Relevant Orders   Lipid panel   Comprehensive metabolic panel   Hemoglobin A1c   Dyslipidemia    Chronic, stable on crestor - tolerating better than lipitor (myalgias). Update FLP today.       Relevant Medications   rosuvastatin (CRESTOR) 20 MG tablet   Other Relevant Orders   Lipid panel   Comprehensive metabolic panel   Hypertension    Chronic, stable. Continue current regimen.       Relevant Medications   rosuvastatin (CRESTOR) 20 MG tablet   NAFLD (nonalcoholic fatty liver disease)    Update LFTs      Relevant Orders   Comprehensive metabolic panel   Obesity, Class I, BMI 30-34.9    Motivated for healthy diet and lifestyle changes for sustainable weight loss. Planning on starting exercise regimen with son.           Meds ordered this encounter  Medications  . glucose blood (ONE TOUCH ULTRA TEST) test strip    Sig: Once daily and as needed    Dispense:  100 each    Refill:  3  . DISCONTD: rosuvastatin (CRESTOR) 20 MG tablet    Sig: Take 1 tablet (20 mg total) by mouth daily.    Dispense:  30 tablet    Refill:  3  . rosuvastatin (CRESTOR) 20 MG tablet    Sig: Take 1 tablet (20 mg total) by mouth daily.    Dispense:  90 tablet    Refill:  3  . Blood Glucose Monitoring Suppl (ONE TOUCH ULTRA MINI) w/Device KIT    Sig: Use as directed    Dispense:  1 each    Refill:  0   Orders Placed This Encounter  Procedures  . Lipid panel  . Comprehensive metabolic panel  . Hemoglobin A1c  . HM DIABETES EYE EXAM    This external order was created through the Results Console.    Follow up plan: Return in about 4 months (around 11/04/2017) for annual  exam, prior fasting for blood work.  Michael Bush, MD

## 2017-07-07 NOTE — Patient Instructions (Addendum)
You are doing well today.  Labs today.  Return in 4 months for physical.

## 2017-07-07 NOTE — Assessment & Plan Note (Signed)
Chronic, stable. Continue current regimen. 

## 2017-10-26 ENCOUNTER — Other Ambulatory Visit: Payer: Self-pay | Admitting: Family Medicine

## 2017-10-26 DIAGNOSIS — E1165 Type 2 diabetes mellitus with hyperglycemia: Secondary | ICD-10-CM

## 2017-10-26 DIAGNOSIS — E785 Hyperlipidemia, unspecified: Secondary | ICD-10-CM

## 2017-10-27 ENCOUNTER — Other Ambulatory Visit (INDEPENDENT_AMBULATORY_CARE_PROVIDER_SITE_OTHER): Payer: BLUE CROSS/BLUE SHIELD

## 2017-10-27 ENCOUNTER — Other Ambulatory Visit: Payer: BLUE CROSS/BLUE SHIELD

## 2017-10-27 DIAGNOSIS — E785 Hyperlipidemia, unspecified: Secondary | ICD-10-CM

## 2017-10-27 DIAGNOSIS — E1165 Type 2 diabetes mellitus with hyperglycemia: Secondary | ICD-10-CM

## 2017-10-27 LAB — COMPREHENSIVE METABOLIC PANEL
ALT: 59 U/L — AB (ref 0–53)
AST: 51 U/L — AB (ref 0–37)
Albumin: 4.5 g/dL (ref 3.5–5.2)
Alkaline Phosphatase: 73 U/L (ref 39–117)
BILIRUBIN TOTAL: 1.5 mg/dL — AB (ref 0.2–1.2)
BUN: 16 mg/dL (ref 6–23)
CO2: 30 meq/L (ref 19–32)
Calcium: 9.9 mg/dL (ref 8.4–10.5)
Chloride: 104 mEq/L (ref 96–112)
Creatinine, Ser: 1.2 mg/dL (ref 0.40–1.50)
GFR: 69.59 mL/min (ref >60.00)
GLUCOSE: 121 mg/dL — AB (ref 70–99)
POTASSIUM: 4.2 meq/L (ref 3.5–5.1)
SODIUM: 143 meq/L (ref 135–145)
Total Protein: 7.5 g/dL (ref 6.0–8.3)

## 2017-10-27 LAB — LIPID PANEL
CHOL/HDL RATIO: 4
Cholesterol: 118 mg/dL (ref 0–200)
HDL: 32 mg/dL — ABNORMAL LOW (ref >39.00)
LDL CALC: 60 mg/dL (ref 0–99)
NONHDL: 86.35
Triglycerides: 134 mg/dL (ref 0.0–149.0)
VLDL: 26.8 mg/dL (ref 0.0–40.0)

## 2017-10-27 LAB — HEMOGLOBIN A1C: Hgb A1c MFr Bld: 7.4 % — ABNORMAL HIGH (ref 4.6–6.5)

## 2017-11-03 ENCOUNTER — Encounter: Payer: BLUE CROSS/BLUE SHIELD | Admitting: Family Medicine

## 2017-11-10 ENCOUNTER — Encounter: Payer: Self-pay | Admitting: Family Medicine

## 2017-11-10 ENCOUNTER — Ambulatory Visit (INDEPENDENT_AMBULATORY_CARE_PROVIDER_SITE_OTHER): Payer: BLUE CROSS/BLUE SHIELD | Admitting: Family Medicine

## 2017-11-10 VITALS — BP 120/84 | HR 83 | Temp 98.9°F | Ht 69.25 in | Wt 228.5 lb

## 2017-11-10 DIAGNOSIS — Z0000 Encounter for general adult medical examination without abnormal findings: Secondary | ICD-10-CM | POA: Diagnosis not present

## 2017-11-10 DIAGNOSIS — E669 Obesity, unspecified: Secondary | ICD-10-CM

## 2017-11-10 DIAGNOSIS — E785 Hyperlipidemia, unspecified: Secondary | ICD-10-CM | POA: Diagnosis not present

## 2017-11-10 DIAGNOSIS — I10 Essential (primary) hypertension: Secondary | ICD-10-CM

## 2017-11-10 DIAGNOSIS — Z23 Encounter for immunization: Secondary | ICD-10-CM

## 2017-11-10 DIAGNOSIS — E1165 Type 2 diabetes mellitus with hyperglycemia: Secondary | ICD-10-CM

## 2017-11-10 DIAGNOSIS — K760 Fatty (change of) liver, not elsewhere classified: Secondary | ICD-10-CM

## 2017-11-10 MED ORDER — PIOGLITAZONE HCL 15 MG PO TABS: 15.0000 mg | ORAL_TABLET | Freq: Every day | ORAL | 6 refills | Status: DC

## 2017-11-10 MED ORDER — ROSUVASTATIN CALCIUM 10 MG PO TABS
10.0000 mg | ORAL_TABLET | Freq: Every day | ORAL | 1 refills | Status: DC
Start: 2017-11-10 — End: 2018-02-19

## 2017-11-10 NOTE — Patient Instructions (Addendum)
Tdap today Cholesterol levels are wonderful - let's try lower dose of crestor.  Start second diabetes medicine actos 15mg  daily in the morning to help control sugars and to help liver.  You are doing well today. When checking sugars, check either fasting before a meal (goal 80-120) or 2 hours after a meal (goal <180). Return in 6 months for diabetes check.   Pioglitazone tablets What is this medicine? PIOGLITAZONE (pye oh GLI ta zone) helps to treat type 2 diabetes. It helps to control blood sugar. Treatment is combined with diet and exercise. This medicine may be used for other purposes; ask your health care provider or pharmacist if you have questions. COMMON BRAND NAME(S): Actos What should I tell my health care provider before I take this medicine? They need to know if you have any of these conditions: -bladder cancer -diabetic ketoacidosis -eye disease called macular edema -heart disease -heart failure -kidney disease -liver disease -polycystic ovary syndrome -premenopausal -swelling of the arms, legs, or feet -type 1 diabetes -an unusual or allergic reaction to pioglitazone, other medicines, foods, dyes, or preservatives -pregnant or trying to get pregnant -breast-feeding How should I use this medicine? Take this medicine by mouth with a glass of water. Follow the directions on the prescription label. Take your medicine at the same time each day. Do not take more often than directed. A special MedGuide will be given to you by the pharmacist with each prescription and refill. Be sure to read this information carefully each time. Talk to your pediatrician regarding the use of this medicine in children. Special care may be needed. Overdosage: If you think you have taken too much of this medicine contact a poison control center or emergency room at once. NOTE: This medicine is only for you. Do not share this medicine with others. What if I miss a dose? If you miss a dose, take it  as soon as you can. If it is almost time for your next dose, take only that dose. Do not take double or extra doses. What may interact with this medicine? -atorvastatin -birth control pills or other hormonal methods of birth control -bosentan -itraconazole -ketoconazole -midazolam -nifedipine -other medicines for diabetes, including insulin -topiramate Many medications may cause an increase or decrease in blood sugar, these include: -alcohol containing beverages -aspirin and aspirin-like drugs -chloramphenicol -chromium -diuretics -male hormones, like estrogens or progestins and birth control pills -heart medicines -isoniazid -male hormones or anabolic steroids -medicines for weight loss -medicines for allergies, asthma, cold, or cough -medicines for mental problems -medicines called MAO Inhibitors like Nardil, Parnate, Marplan, Eldepryl -niacin -NSAIDs, medicines for pain and inflammation, like ibuprofen or naproxen -pentamidine -phenytoin -probenecid -quinolone antibiotics like ciprofloxacin, levofloxacin, ofloxacin -some herbal dietary supplements -steroid medicines like prednisone or cortisone -thyroid medicine This list may not describe all possible interactions. Give your health care provider a list of all the medicines, herbs, non-prescription drugs, or dietary supplements you use. Also tell them if you smoke, drink alcohol, or use illegal drugs. Some items may interact with your medicine. What should I watch for while using this medicine? Visit your doctor or health care professional for regular checks on your progress. You may need regular tests to make sure your liver is working properly. A test called the HbA1C (A1C) will be monitored. This is a simple blood test. It measures your blood sugar control over the last 2 to 3 months. You will receive this test every 3 to 6 months. Learn how to check  your blood sugar. Learn the symptoms of low and high blood sugar and how  to manage them. Always carry a quick-source of sugar with you in case you have symptoms of low blood sugar. Examples include hard sugar candy or glucose tablets. Make sure others know that you can choke if you eat or drink when you develop serious symptoms of low blood sugar, such as seizures or unconsciousness. They must get medical help at once. Tell your doctor or health care professional if you have high blood sugar. You might need to change the dose of your medicine. If you are sick or exercising more than usual, you might need to change the dose of your medicine. Do not skip meals. Ask your doctor or health care professional if you should avoid alcohol. Many nonprescription cough and cold products contain sugar or alcohol. These can affect blood sugar. This medicine may increase your risk of having certain heart problems. Get medical help right away if you have any chest pain or tightness, or pain that radiates to the jaw or down the arm, and shortness of breath. These may be signs of a serious medical condition. This medicine may cause ovulation in premenopausal women who do not have regular monthly periods. This may increase your chances of becoming pregnant. You should not take this medicine if you become pregnant or think you may be pregnant. Talk with your doctor or health care professional about your birth control options while taking this medicine. Contact your doctor or health care professional right away if think you are pregnant. Wear a medical ID bracelet or chain, and carry a card that describes your disease and details of your medicine and dosage times. What side effects may I notice from receiving this medicine? Side effects that you should report to your doctor or health care professional as soon as possible: -allergic reactions like skin rash, itching or hives, swelling of the face, lips, or tongue -blood in the urine -bone fractures -breathing problems -changes in vision -dark  urine -fever, chills -increased urination -pain when urinating -signs and symptoms of low blood sugar such as feeling anxious, confusion, dizziness, increased hunger, unusually weak or tired, sweating, shakiness, cold, irritable, headache, blurred vision, fast heartbeat, loss of consciousness -sudden weight gain -swelling of the ankles, feet, hands -yellowing of the eyes or skin Side effects that usually do not require medical attention (report to your doctor or health care professional if they continue or are bothersome): -headache -mild joint or muscle pain -stuffy or runny nose -sore throat This list may not describe all possible side effects. Call your doctor for medical advice about side effects. You may report side effects to FDA at 1-800-FDA-1088. Where should I keep my medicine? Keep out of the reach of children. Store at room temperature between 15 and 30 degrees C (59 and 86 degrees F). Keep container tightly closed and protect from moisture and humidity. Throw away any unused medicine after the expiration date. NOTE: This sheet is a summary. It may not cover all possible information. If you have questions about this medicine, talk to your doctor, pharmacist, or health care provider.  2018 Elsevier/Gold Standard (2012-08-21 14:51:48)

## 2017-11-10 NOTE — Assessment & Plan Note (Signed)
LFTs slowly improving with better sugar control. Start actos

## 2017-11-10 NOTE — Assessment & Plan Note (Signed)
Chronic, stable. Continue current regimen. 

## 2017-11-10 NOTE — Assessment & Plan Note (Signed)
Chronic, continues improving but remaining above goal despite max dose metformin and healthy diet changes. Will add low dose actos. Advised monitoring for pedal edema, need to monitor for bladder cancer.

## 2017-11-10 NOTE — Assessment & Plan Note (Addendum)
Encouraged ongoing healthy diet and lifestyle changes to achieve sustainable weight loss.

## 2017-11-10 NOTE — Addendum Note (Signed)
Addended by: Brenton Grills on: 5/43/0148 40:39 AM   Modules accepted: Orders

## 2017-11-10 NOTE — Progress Notes (Signed)
BP 120/84 (BP Location: Left Arm, Patient Position: Sitting, Cuff Size: Large)   Pulse 83   Temp 98.9 F (37.2 C) (Oral)   Ht 5' 9.25" (1.759 m)   Wt 228 lb 8 oz (103.6 kg)   SpO2 96%   BMI 33.50 kg/m    CC: CPE Subjective:    Patient ID: Michael Mcconnell, male    DOB: [birth year], 47 y.o.   MRN: [id number]  HPI: Michael Mcconnell is a 47 y.o. male presenting on 11/10/2017 for Annual Exam   DM - sugars staying elevated. Checks regularly either fasting or randomly - fasting 120s. Random 180s. Has seen DSME.   Preventative: Flu shot at work Pneumovax 2017 Td 2009  Seatbelt use discussed Sunscreen use discussed. No changing moles on skin. Ex smoker quit 2008, prior 2-3 cig/day  Alcohol - rare Dentist Q6 mo Eye exam - yearly  Caffeine: 1-2 cups/day Lives with step daughter, wife, 2 sons, 2 dogs Occupation: Librarian, academic for [employer]  Edu: studying business management through [employer] Activity: weights and treadmill Diet: good water, working on fruits/vegetables.   Relevant past medical, surgical, family and social history reviewed and updated as indicated. Interim medical history since our last visit reviewed. Allergies and medications reviewed and updated. Outpatient Medications Prior to Visit  Medication Sig Dispense Refill  . Blood Glucose Monitoring Suppl (ONE TOUCH ULTRA MINI) w/Device KIT Use as directed 1 each 0  . glucose blood (ONE TOUCH ULTRA TEST) test strip Once daily and as needed 100 each 3  . hydrochlorothiazide (HYDRODIURIL) 25 MG tablet Take 1 tablet (25 mg total) by mouth daily. 90 tablet 3  . ibuprofen (ADVIL,MOTRIN) 600 MG tablet Take 1 tablet (600 mg total) by mouth every 8 (eight) hours as needed. 30 tablet 0  . losartan (COZAAR) 50 MG tablet Take 1 tablet (50 mg total) by mouth daily. 90 tablet 3  . metFORMIN (GLUCOPHAGE-XR) 500 MG 24 hr tablet Take 2 tablets (1,000 mg total) 2 (two) times daily by mouth. 360 tablet 3  . Multiple  Vitamins-Minerals (MULTIVITAMIN PO) Take 1 capsule by mouth daily.    . sildenafil (VIAGRA) 50 MG tablet Take 1 tablet (50 mg total) daily as needed by mouth for erectile dysfunction. 10 tablet 1  . rosuvastatin (CRESTOR) 20 MG tablet Take 1 tablet (20 mg total) by mouth daily. 90 tablet 3   No facility-administered medications prior to visit.      Per HPI unless specifically indicated in ROS section below Review of Systems  Constitutional: Negative for activity change, appetite change, chills, fatigue, fever and unexpected weight change.  HENT: Negative for hearing loss.   Eyes: Negative for visual disturbance.  Respiratory: Negative for cough, chest tightness, shortness of breath and wheezing.   Cardiovascular: Negative for chest pain, palpitations and leg swelling.  Gastrointestinal: Negative for abdominal distention, abdominal pain, blood in stool, constipation, diarrhea, nausea and vomiting.  Genitourinary: Negative for difficulty urinating and hematuria.  Musculoskeletal: Negative for arthralgias, myalgias and neck pain.  Skin: Negative for rash.  Neurological: Negative for dizziness, seizures, syncope and headaches.  Hematological: Negative for adenopathy. Does not bruise/bleed easily.  Psychiatric/Behavioral: Negative for dysphoric mood. The patient is not nervous/anxious.        Objective:    BP 120/84 (BP Location: Left Arm, Patient Position: Sitting, Cuff Size: Large)   Pulse 83   Temp 98.9 F (37.2 C) (Oral)   Ht 5' 9.25" (1.759 m)   Wt 228 lb 8  oz (103.6 kg)   SpO2 96%   BMI 33.50 kg/m   Wt Readings from Last 3 Encounters:  11/10/17 228 lb 8 oz (103.6 kg)  07/07/17 232 lb (105.2 kg)  04/07/17 232 lb (105.2 kg)    Physical Exam  Constitutional: He is oriented to person, place, and time. He appears well-developed and well-nourished. No distress.  HENT:  Head: Normocephalic and atraumatic.  Right Ear: Hearing, tympanic membrane, external ear and ear canal normal.   Left Ear: Hearing, tympanic membrane, external ear and ear canal normal.  Nose: Nose normal.  Mouth/Throat: Uvula is midline, oropharynx is clear and moist and mucous membranes are normal. No oropharyngeal exudate, posterior oropharyngeal edema or posterior oropharyngeal erythema.  Eyes: Pupils are equal, round, and reactive to light. Conjunctivae and EOM are normal. No scleral icterus.  Neck: Normal range of motion. Neck supple. No thyromegaly present.  Cardiovascular: Normal rate, regular rhythm, normal heart sounds and intact distal pulses.  No murmur heard. Pulses:      Radial pulses are 2+ on the right side, and 2+ on the left side.  Pulmonary/Chest: Effort normal and breath sounds normal. No respiratory distress. He has no wheezes. He has no rales.  Abdominal: Soft. Bowel sounds are normal. He exhibits no distension and no mass. There is no tenderness. There is no rebound and no guarding.  Musculoskeletal: Normal range of motion. He exhibits no edema.  Lymphadenopathy:    He has no cervical adenopathy.  Neurological: He is alert and oriented to person, place, and time.  CN grossly intact, station and gait intact  Skin: Skin is warm and dry. No rash noted.  Psychiatric: He has a normal mood and affect. His behavior is normal. Judgment and thought content normal.  Nursing note and vitals reviewed.  Results for orders placed or performed in visit on 10/27/17  Hemoglobin A1c  Result Value Ref Range   Hgb A1c MFr Bld 7.4 (H) 4.6 - 6.5 %  Comprehensive metabolic panel  Result Value Ref Range   Sodium 143 135 - 145 mEq/L   Potassium 4.2 3.5 - 5.1 mEq/L   Chloride 104 96 - 112 mEq/L   CO2 30 19 - 32 mEq/L   Glucose, Bld 121 (H) 70 - 99 mg/dL   BUN 16 6 - 23 mg/dL   Creatinine, Ser 1.20 0.40 - 1.50 mg/dL   Total Bilirubin 1.5 (H) 0.2 - 1.2 mg/dL   Alkaline Phosphatase 73 39 - 117 U/L   AST 51 (H) 0 - 37 U/L   ALT 59 (H) 0 - 53 U/L   Total Protein 7.5 6.0 - 8.3 g/dL   Albumin 4.5  3.5 - 5.2 g/dL   Calcium 9.9 8.4 - 10.5 mg/dL   GFR 69.59 >60.00 mL/min  Lipid panel  Result Value Ref Range   Cholesterol 118 0 - 200 mg/dL   Triglycerides 134.0 0.0 - 149.0 mg/dL   HDL 32.00 (L) >39.00 mg/dL   VLDL 26.8 0.0 - 40.0 mg/dL   LDL Cholesterol 60 0 - 99 mg/dL   Total CHOL/HDL Ratio 4    NonHDL 86.35       Assessment & Plan:   Problem List Items Addressed This Visit    Obesity, Class I, BMI 30-34.9    Encouraged ongoing healthy diet and lifestyle changes to achieve sustainable weight loss.       NAFLD (nonalcoholic fatty liver disease)    LFTs slowly improving with better sugar control. Start actos  Hypertension    Chronic, stable. Continue current regimen.       Relevant Medications   rosuvastatin (CRESTOR) 10 MG tablet   Health maintenance examination - Primary    Preventative protocols reviewed and updated unless pt declined. Discussed healthy diet and lifestyle.       Dyslipidemia    Chronic, stable on crestor - will decrease dose to 66m daily.  The ASCVD Risk score (Mikey BussingDC Jr., et al., 2013) failed to calculate for the following reasons:   The valid total cholesterol range is 130 to 320 mg/dL   Unable to determine if patient is Non-Hispanic African American       Relevant Medications   rosuvastatin (CRESTOR) 10 MG tablet   Diabetes type 2, uncontrolled ([facility])    Chronic, continues improving but remaining above goal despite max dose metformin and healthy diet changes. Will add low dose actos. Advised monitoring for pedal edema, need to monitor for bladder cancer.       Relevant Medications   rosuvastatin (CRESTOR) 10 MG tablet   pioglitazone (ACTOS) 15 MG tablet       Meds ordered this encounter  Medications  . rosuvastatin (CRESTOR) 10 MG tablet    Sig: Take 1 tablet (10 mg total) by mouth daily.    Dispense:  90 tablet    Refill:  1    Note new sig  . pioglitazone (ACTOS) 15 MG tablet    Sig: Take 1 tablet (15 mg total) by mouth  daily.    Dispense:  30 tablet    Refill:  6   No orders of the defined types were placed in this encounter.   Follow up plan: Return in about 6 months (around 05/12/2018) for follow up visit.  JRia Bush MD

## 2017-11-10 NOTE — Assessment & Plan Note (Signed)
Chronic, stable on crestor - will decrease dose to 10mg  daily.  The ASCVD Risk score Michael Bussing DC Jr., et al., 2013) failed to calculate for the following reasons:   The valid total cholesterol range is 130 to 320 mg/dL   Unable to determine if patient is Non-Hispanic African American

## 2017-11-10 NOTE — Assessment & Plan Note (Signed)
Preventative protocols reviewed and updated unless pt declined. Discussed healthy diet and lifestyle.  

## 2017-11-13 ENCOUNTER — Telehealth: Payer: Self-pay | Admitting: Family Medicine

## 2017-11-13 NOTE — Telephone Encounter (Signed)
Copied from [place] [id number]. Topic: Quick Communication - Rx Refill/Question >> Nov 13, 2017  3:23 PM Marval Regal L wrote: Medication: pioglitazone (ACTOS) 15 MG tablet Angelica from [facility] is calling to request a 90 day supply to be called in because of insurance. Please advise  Best call back [phone] Ref#[phone]  Preferred Pharmacy (with phone number or street name):  Agent: Please be advised that RX refills may take up to 3 business days. We ask that you follow-up with your pharmacy.

## 2017-11-14 MED ORDER — PIOGLITAZONE HCL 15 MG PO TABS: 15.0000 mg | ORAL_TABLET | Freq: Every day | ORAL | 1 refills | Status: DC

## 2017-11-14 NOTE — Telephone Encounter (Signed)
LOV  11/10/17 Dr. Danise Mina Last refill 11/10/17  # 30 with 6 refills

## 2017-11-14 NOTE — Telephone Encounter (Signed)
Dr Darnell Level said OK to change to 90 day rx. Refilled as requested to [facility] home delivery for actos 15 mg. I spoke with Christina at [facility] to cancel actos 15 mg # 30 x 6 rx and new rx for actos 15 mg # 90 x 1 has been sent electronically. Christina voiced understanding.

## 2017-11-17 ENCOUNTER — Telehealth: Payer: Self-pay

## 2017-11-17 NOTE — Telephone Encounter (Addendum)
Started PA for pioglitazone 15 mg tab, key:  [id number].   PA approved effective 11/17/2017 until 11/17/2018.

## 2017-11-21 NOTE — Telephone Encounter (Signed)
Received faxed PA approval effective 11/17/17 until 11/17/2018.

## 2018-01-17 ENCOUNTER — Other Ambulatory Visit: Payer: Self-pay | Admitting: *Deleted

## 2018-01-17 ENCOUNTER — Other Ambulatory Visit: Payer: Self-pay | Admitting: Family Medicine

## 2018-01-17 MED ORDER — HYDROCHLOROTHIAZIDE 25 MG PO TABS: 25.0000 mg | ORAL_TABLET | Freq: Every day | ORAL | 3 refills | Status: DC

## 2018-01-17 MED ORDER — LOSARTAN POTASSIUM 50 MG PO TABS: 50.0000 mg | ORAL_TABLET | Freq: Every day | ORAL | 3 refills | Status: DC

## 2018-01-17 NOTE — Telephone Encounter (Signed)
Copied from [place] [id number]. Topic: Quick Communication - Rx Refill/Question >> Jan 17, 2018 12:16 PM Michael Mcconnell wrote: Medication: losartan (COZAAR) 50 MG tablet [[id number]] and hydrochlorothiazide (HYDRODIURIL) 25 MG tablet [[id number]]    Preferred Pharmacy (with phone number or street name): [place], [place]  [phone] (Phone) [phone] (Fax)

## 2018-01-17 NOTE — Addendum Note (Signed)
Addended by: Modena Nunnery on: 01/17/2018 12:51 PM   Modules accepted: Orders

## 2018-01-17 NOTE — Telephone Encounter (Signed)
losartan refill Last Refill: ? # 90 Last OV: 11/10/17 PCP: Dr Danise Mina Pharmacy:[facility]  Hydrodiuril refill  Last Refill: ? # 90 Last OV: 11/10/17 PCP: Dr Danise Mina Pharmacy:[facility]

## 2018-02-19 ENCOUNTER — Other Ambulatory Visit: Payer: Self-pay | Admitting: Family Medicine

## 2018-04-21 ENCOUNTER — Other Ambulatory Visit: Payer: Self-pay | Admitting: Family Medicine

## 2018-05-16 NOTE — Progress Notes (Signed)
BP 118/84 (BP Location: Left Arm, Patient Position: Sitting, Cuff Size: Large)   Pulse 74   Temp 97.6 F (36.4 C) (Oral)   Ht 5' 9.25" (1.759 m)   Wt 225 lb 12 oz (102.4 kg)   SpO2 97%   BMI 33.10 kg/m    CC: 6 mo f/u visit Subjective:    Patient ID: Michael Mcconnell, male    DOB: December 15, 1970, 47 y.o.   MRN: [id number]  HPI: Michael Mcconnell is a 47 y.o. male presenting on 05/18/2018 for Diabetes (Here for 6 mo f/u. )    DM - does regularly check sugars fasting low 100s. Compliant with antihyperglycemic regimen which includes: actos 72m daily, metformin XR 501m2 tab bid. Denies low sugars or hypoglycemic symptoms. Denies paresthesias. Last diabetic eye exam 04/2017 - due, scheduled 05/2018. Pneumovax: 2017. Prevnar: not due. Glucometer brand: one-touch. DSME: completed.  Lab Results  Component Value Date   HGBA1C 7.4 (H) 10/27/2017   Diabetic Foot Exam - Simple   Simple Foot Form Diabetic Foot exam was performed with the following findings:  Yes 05/18/2018  7:26 AM  Visual Inspection No deformities, no ulcerations, no other skin breakdown bilaterally:  Yes Sensation Testing Intact to touch and monofilament testing bilaterally:  Yes Pulse Check Posterior Tibialis and Dorsalis pulse intact bilaterally:  Yes Comments varicose veins lower legs    Lab Results  Component Value Date   MICROALBUR 5.2 (H) 09/11/2015         Relevant past medical, surgical, family and social history reviewed and updated as indicated. Interim medical history since our last visit reviewed. Allergies and medications reviewed and updated. Outpatient Medications Prior to Visit  Medication Sig Dispense Refill  . Blood Glucose Monitoring Suppl (ONE TOUCH ULTRA MINI) w/Device KIT Use as directed 1 each 0  . glucose blood (ONE TOUCH ULTRA TEST) test strip Once daily and as needed 100 each 3  . hydrochlorothiazide (HYDRODIURIL) 25 MG tablet Take 1 tablet (25 mg total) by mouth daily. 90 tablet 3  .  ibuprofen (ADVIL,MOTRIN) 600 MG tablet Take 1 tablet (600 mg total) by mouth every 8 (eight) hours as needed. 30 tablet 0  . losartan (COZAAR) 50 MG tablet Take 1 tablet (50 mg total) by mouth daily. 90 tablet 3  . metFORMIN (GLUCOPHAGE-XR) 500 MG 24 hr tablet TAKE 2 TABLETS TWICE A DAY 360 tablet 0  . Multiple Vitamins-Minerals (MULTIVITAMIN PO) Take 1 capsule by mouth daily.    . pioglitazone (ACTOS) 15 MG tablet Take 1 tablet (15 mg total) by mouth daily. 90 tablet 1  . rosuvastatin (CRESTOR) 10 MG tablet TAKE 1 TABLET DAILY (NEW   DIRECTIONS) 90 tablet 1  . sildenafil (VIAGRA) 50 MG tablet Take 1 tablet (50 mg total) daily as needed by mouth for erectile dysfunction. 10 tablet 1   No facility-administered medications prior to visit.      Per HPI unless specifically indicated in ROS section below Review of Systems Objective:    BP 118/84 (BP Location: Left Arm, Patient Position: Sitting, Cuff Size: Large)   Pulse 74   Temp 97.6 F (36.4 C) (Oral)   Ht 5' 9.25" (1.759 m)   Wt 225 lb 12 oz (102.4 kg)   SpO2 97%   BMI 33.10 kg/m   Wt Readings from Last 3 Encounters:  05/18/18 225 lb 12 oz (102.4 kg)  11/10/17 228 lb 8 oz (103.6 kg)  07/07/17 232 lb (105.2 kg)    Physical Exam  Vitals signs and nursing note reviewed.  Constitutional:      General: He is not in acute distress.    Appearance: He is well-developed.  HENT:     Head: Normocephalic and atraumatic.     Right Ear: External ear normal.     Left Ear: External ear normal.     Nose: Nose normal.     Mouth/Throat:     Pharynx: No oropharyngeal exudate.  Eyes:     General: No scleral icterus.    Conjunctiva/sclera: Conjunctivae normal.     Pupils: Pupils are equal, round, and reactive to light.  Neck:     Musculoskeletal: Normal range of motion and neck supple.  Cardiovascular:     Rate and Rhythm: Normal rate and regular rhythm.     Heart sounds: Normal heart sounds. No murmur.  Pulmonary:     Effort: Pulmonary  effort is normal. No respiratory distress.     Breath sounds: Normal breath sounds. No wheezing or rales.  Musculoskeletal:     Comments: See HPI for foot exam if done  Lymphadenopathy:     Cervical: No cervical adenopathy.  Skin:    General: Skin is warm and dry.     Findings: No rash.       Results for orders placed or performed in visit on 10/27/17  Hemoglobin A1c  Result Value Ref Range   Hgb A1c MFr Bld 7.4 (H) 4.6 - 6.5 %  Comprehensive metabolic panel  Result Value Ref Range   Sodium 143 135 - 145 mEq/L   Potassium 4.2 3.5 - 5.1 mEq/L   Chloride 104 96 - 112 mEq/L   CO2 30 19 - 32 mEq/L   Glucose, Bld 121 (H) 70 - 99 mg/dL   BUN 16 6 - 23 mg/dL   Creatinine, Ser 1.20 0.40 - 1.50 mg/dL   Total Bilirubin 1.5 (H) 0.2 - 1.2 mg/dL   Alkaline Phosphatase 73 39 - 117 U/L   AST 51 (H) 0 - 37 U/L   ALT 59 (H) 0 - 53 U/L   Total Protein 7.5 6.0 - 8.3 g/dL   Albumin 4.5 3.5 - 5.2 g/dL   Calcium 9.9 8.4 - 10.5 mg/dL   GFR 69.59 >60.00 mL/min  Lipid panel  Result Value Ref Range   Cholesterol 118 0 - 200 mg/dL   Triglycerides 134.0 0.0 - 149.0 mg/dL   HDL 32.00 (L) >39.00 mg/dL   VLDL 26.8 0.0 - 40.0 mg/dL   LDL Cholesterol 60 0 - 99 mg/dL   Total CHOL/HDL Ratio 4    NonHDL 86.35    Assessment & Plan:   Problem List Items Addressed This Visit    NAFLD (nonalcoholic fatty liver disease)   Relevant Orders   Comprehensive metabolic panel   Hypertension    Chronic, stable. Continue current regimen.       Dyslipidemia    Chronic, stable on crestor - will update FLP on lower crestor dose.  The ASCVD Risk score [name], et al., 2013) failed to calculate for the following reasons:   The valid total cholesterol range is 130 to 320 mg/dL   Unable to determine if patient is Non-Hispanic African American       Relevant Orders   Lipid panel   Comprehensive metabolic panel   Controlled diabetes mellitus type 2 with complications (HCC) - Primary    Chronic, stable.  Improved control based on cbg's by recall. Continue current regimen. Update labs today. Foot exam today.  Relevant Orders   Hemoglobin A1c       No orders of the defined types were placed in this encounter.  Orders Placed This Encounter  Procedures  . Lipid panel  . Comprehensive metabolic panel  . Hemoglobin A1c    Follow up plan: Return in about 6 months (around 11/17/2018) for annual exam, prior fasting for blood work.  Ria Bush, MD

## 2018-05-18 ENCOUNTER — Encounter: Payer: Self-pay | Admitting: Family Medicine

## 2018-05-18 ENCOUNTER — Ambulatory Visit: Payer: BLUE CROSS/BLUE SHIELD | Admitting: Family Medicine

## 2018-05-18 VITALS — BP 118/84 | HR 74 | Temp 97.6°F | Ht 69.25 in | Wt 225.8 lb

## 2018-05-18 DIAGNOSIS — I10 Essential (primary) hypertension: Secondary | ICD-10-CM

## 2018-05-18 DIAGNOSIS — K760 Fatty (change of) liver, not elsewhere classified: Secondary | ICD-10-CM

## 2018-05-18 DIAGNOSIS — E118 Type 2 diabetes mellitus with unspecified complications: Secondary | ICD-10-CM

## 2018-05-18 DIAGNOSIS — E785 Hyperlipidemia, unspecified: Secondary | ICD-10-CM | POA: Diagnosis not present

## 2018-05-18 LAB — COMPREHENSIVE METABOLIC PANEL
ALBUMIN: 4.5 g/dL (ref 3.5–5.2)
ALK PHOS: 70 U/L (ref 39–117)
ALT: 38 U/L (ref 0–53)
AST: 33 U/L (ref 0–37)
BILIRUBIN TOTAL: 1 mg/dL (ref 0.2–1.2)
BUN: 16 mg/dL (ref 6–23)
CO2: 31 mEq/L (ref 19–32)
Calcium: 9.5 mg/dL (ref 8.4–10.5)
Chloride: 103 mEq/L (ref 96–112)
Creatinine, Ser: 1.2 mg/dL (ref 0.40–1.50)
GFR: 69.41 mL/min (ref >60.00)
GLUCOSE: 130 mg/dL — AB (ref 70–99)
POTASSIUM: 4.2 meq/L (ref 3.5–5.1)
Sodium: 141 mEq/L (ref 135–145)
TOTAL PROTEIN: 7.2 g/dL (ref 6.0–8.3)

## 2018-05-18 LAB — LIPID PANEL
CHOLESTEROL: 132 mg/dL (ref 0–200)
HDL: 38.6 mg/dL — AB (ref >39.00)
LDL Cholesterol: 56 mg/dL (ref 0–99)
NonHDL: 93.81
TRIGLYCERIDES: 187 mg/dL — AB (ref 0.0–149.0)
Total CHOL/HDL Ratio: 3
VLDL: 37.4 mg/dL (ref 0.0–40.0)

## 2018-05-18 LAB — HEMOGLOBIN A1C: HEMOGLOBIN A1C: 6.9 % — AB (ref 4.6–6.5)

## 2018-05-18 NOTE — Assessment & Plan Note (Signed)
Chronic, stable on crestor - will update FLP on lower crestor dose.  The ASCVD Risk score Michael Mcconnell., et al., 2013) failed to calculate for the following reasons:   The valid total cholesterol range is 130 to 320 mg/dL   Unable to determine if patient is Non-Hispanic African American

## 2018-05-18 NOTE — Assessment & Plan Note (Signed)
Chronic, stable. Improved control based on cbg's by recall. Continue current regimen. Update labs today. Foot exam today.

## 2018-05-18 NOTE — Patient Instructions (Signed)
Labs today. You are doing well today Return as needed or in 6 months for physical.

## 2018-05-18 NOTE — Assessment & Plan Note (Signed)
Chronic, stable. Continue current regimen. 

## 2018-06-01 LAB — HM DIABETES EYE EXAM

## 2018-06-02 ENCOUNTER — Encounter: Payer: Self-pay | Admitting: Family Medicine

## 2018-06-02 DIAGNOSIS — E1136 Type 2 diabetes mellitus with diabetic cataract: Secondary | ICD-10-CM | POA: Insufficient documentation

## 2018-06-04 ENCOUNTER — Encounter: Payer: Self-pay | Admitting: Family Medicine

## 2018-07-24 ENCOUNTER — Other Ambulatory Visit: Payer: Self-pay | Admitting: Family Medicine

## 2018-11-16 ENCOUNTER — Other Ambulatory Visit (INDEPENDENT_AMBULATORY_CARE_PROVIDER_SITE_OTHER): Payer: BC Managed Care – PPO

## 2018-11-16 ENCOUNTER — Other Ambulatory Visit: Payer: Self-pay | Admitting: Family Medicine

## 2018-11-16 DIAGNOSIS — K760 Fatty (change of) liver, not elsewhere classified: Secondary | ICD-10-CM | POA: Diagnosis not present

## 2018-11-16 DIAGNOSIS — E1136 Type 2 diabetes mellitus with diabetic cataract: Secondary | ICD-10-CM | POA: Diagnosis not present

## 2018-11-16 DIAGNOSIS — E785 Hyperlipidemia, unspecified: Secondary | ICD-10-CM

## 2018-11-16 LAB — COMPREHENSIVE METABOLIC PANEL
ALT: 60 U/L — ABNORMAL HIGH (ref 0–53)
AST: 51 U/L — ABNORMAL HIGH (ref 0–37)
Albumin: 4.4 g/dL (ref 3.5–5.2)
Alkaline Phosphatase: 82 U/L (ref 39–117)
BUN: 17 mg/dL (ref 6–23)
CO2: 31 mEq/L (ref 19–32)
Calcium: 8.7 mg/dL (ref 8.4–10.5)
Chloride: 104 mEq/L (ref 96–112)
Creatinine, Ser: 1.21 mg/dL (ref 0.40–1.50)
GFR: 64.54 mL/min (ref >60.00)
Glucose, Bld: 125 mg/dL — ABNORMAL HIGH (ref 70–99)
Potassium: 3.9 mEq/L (ref 3.5–5.1)
Sodium: 142 mEq/L (ref 135–145)
Total Bilirubin: 1.3 mg/dL — ABNORMAL HIGH (ref 0.2–1.2)
Total Protein: 6.7 g/dL (ref 6.0–8.3)

## 2018-11-16 LAB — LIPID PANEL
Cholesterol: 121 mg/dL (ref 0–200)
HDL: 30.2 mg/dL — ABNORMAL LOW (ref >39.00)
NonHDL: 90.98
Total CHOL/HDL Ratio: 4
Triglycerides: 217 mg/dL — ABNORMAL HIGH (ref 0.0–149.0)
VLDL: 43.4 mg/dL — ABNORMAL HIGH (ref 0.0–40.0)

## 2018-11-16 LAB — LDL CHOLESTEROL, DIRECT: Direct LDL: 61 mg/dL

## 2018-11-16 LAB — HEMOGLOBIN A1C: Hgb A1c MFr Bld: 7.6 % — ABNORMAL HIGH (ref 4.6–6.5)

## 2018-11-30 ENCOUNTER — Encounter: Payer: Self-pay | Admitting: Family Medicine

## 2018-11-30 ENCOUNTER — Other Ambulatory Visit: Payer: Self-pay

## 2018-11-30 ENCOUNTER — Ambulatory Visit (INDEPENDENT_AMBULATORY_CARE_PROVIDER_SITE_OTHER): Payer: BC Managed Care – PPO | Admitting: Family Medicine

## 2018-11-30 VITALS — BP 117/84 | HR 88 | Temp 97.3°F | Ht 69.25 in | Wt 228.0 lb

## 2018-11-30 DIAGNOSIS — Z0000 Encounter for general adult medical examination without abnormal findings: Secondary | ICD-10-CM | POA: Diagnosis not present

## 2018-11-30 DIAGNOSIS — E1136 Type 2 diabetes mellitus with diabetic cataract: Secondary | ICD-10-CM

## 2018-11-30 DIAGNOSIS — E118 Type 2 diabetes mellitus with unspecified complications: Secondary | ICD-10-CM | POA: Diagnosis not present

## 2018-11-30 DIAGNOSIS — K648 Other hemorrhoids: Secondary | ICD-10-CM

## 2018-11-30 DIAGNOSIS — IMO0002 Reserved for concepts with insufficient information to code with codable children: Secondary | ICD-10-CM

## 2018-11-30 DIAGNOSIS — I10 Essential (primary) hypertension: Secondary | ICD-10-CM

## 2018-11-30 DIAGNOSIS — E785 Hyperlipidemia, unspecified: Secondary | ICD-10-CM

## 2018-11-30 DIAGNOSIS — K760 Fatty (change of) liver, not elsewhere classified: Secondary | ICD-10-CM

## 2018-11-30 DIAGNOSIS — E669 Obesity, unspecified: Secondary | ICD-10-CM

## 2018-11-30 DIAGNOSIS — E1165 Type 2 diabetes mellitus with hyperglycemia: Secondary | ICD-10-CM

## 2018-11-30 NOTE — Assessment & Plan Note (Signed)
Chronic, mildly deteriorated with trig elevation, HDL remains low. LDL great control on rosuvastatin.  The ASCVD Risk score [name], et al., 2013) failed to calculate for the following reasons:   The valid total cholesterol range is 130 to 320 mg/dL   Unable to determine if patient is Non-Hispanic African American

## 2018-11-30 NOTE — Progress Notes (Signed)
Virtual visit completed through [employer]. Due to national recommendations of social distancing due to COVID-19, a virtual visit is felt to be most appropriate for this patient at this time. Reviewed limitations of a virtual visit.   Patient location: home Provider location: [place] at [facility], office If any vitals were documented, they were collected by patient at home unless specified below.    BP 117/84 (BP Location: Right Arm)   Pulse 88   Temp (!) 97.3 F (36.3 C)   Ht 5' 9.25" (1.759 m)   Wt 228 lb (103.4 kg)   BMI 33.43 kg/m    CC: CPE Subjective:    Patient ID: Michael Mcconnell, male    DOB: 09-15-1970, 48 y.o.   MRN: [id number]  HPI: Michael Mcconnell is a 48 y.o. male presenting on 11/30/2018 for Annual Exam   Preventative: Flu shot at work Pneumovax 2017 Td 2009, Tdap 10/2017 Seatbeltuse discussed Sunscreen use discussed.No changing moles on skin. Ex smoker quit 2008, prior 2-3 cig/day  Alcohol - rare Dentist Q6 mo Eye exam - yearly  Caffeine: 1-2 cups/day Lives with step daughter, wife, 2 sons, 2 dogs Occupation: Librarian, academic for [employer] Edu: studying business management through [employer] Activity: weights and treadmill Diet: good water, working on fruits/vegetables      Relevant past medical, surgical, family and social history reviewed and updated as indicated. Interim medical history since our last visit reviewed. Allergies and medications reviewed and updated. Outpatient Medications Prior to Visit  Medication Sig Dispense Refill  . Blood Glucose Monitoring Suppl (ONE TOUCH ULTRA MINI) w/Device KIT Use as directed 1 each 0  . glucose blood (ONE TOUCH ULTRA TEST) test strip Once daily and as needed 100 each 3  . hydrochlorothiazide (HYDRODIURIL) 25 MG tablet Take 1 tablet (25 mg total) by mouth daily. 90 tablet 3  . ibuprofen (ADVIL,MOTRIN) 600 MG tablet Take 1 tablet (600 mg total) by mouth every 8 (eight) hours as  needed. 30 tablet 0  . losartan (COZAAR) 50 MG tablet Take 1 tablet (50 mg total) by mouth daily. 90 tablet 3  . metFORMIN (GLUCOPHAGE-XR) 500 MG 24 hr tablet TAKE 2 TABLETS TWICE A DAY 360 tablet 1  . Multiple Vitamins-Minerals (MULTIVITAMIN PO) Take 1 capsule by mouth daily.    . pioglitazone (ACTOS) 15 MG tablet Take 1 tablet (15 mg total) by mouth daily. 90 tablet 1  . rosuvastatin (CRESTOR) 10 MG tablet TAKE 1 TABLET DAILY (NEW   DIRECTIONS) 90 tablet 1  . sildenafil (VIAGRA) 50 MG tablet Take 1 tablet (50 mg total) daily as needed by mouth for erectile dysfunction. 10 tablet 1   No facility-administered medications prior to visit.      Per HPI unless specifically indicated in ROS section below Review of Systems  Constitutional: Negative for activity change, appetite change, chills, fatigue, fever and unexpected weight change.  HENT: Negative for hearing loss.   Eyes: Negative for visual disturbance.  Respiratory: Negative for cough, chest tightness, shortness of breath and wheezing.   Cardiovascular: Negative for chest pain, palpitations and leg swelling.  Gastrointestinal: Positive for blood in stool (hemorrhoids - has had banding (Medoff) with recurrence). Negative for abdominal distention, abdominal pain, constipation, diarrhea, nausea and vomiting.  Genitourinary: Negative for difficulty urinating and hematuria.  Musculoskeletal: Negative for arthralgias, myalgias and neck pain.  Skin: Negative for rash.  Neurological: Negative for dizziness, seizures, syncope and headaches.  Hematological: Negative for adenopathy. Does not bruise/bleed easily.  Psychiatric/Behavioral: Negative for dysphoric mood. The patient is not nervous/anxious.    Objective:    BP 117/84 (BP Location: Right Arm)   Pulse 88   Temp (!) 97.3 F (36.3 C)   Ht 5' 9.25" (1.759 m)   Wt 228 lb (103.4 kg)   BMI 33.43 kg/m   Wt Readings from Last 3 Encounters:  11/30/18 228 lb (103.4 kg)  05/18/18 225 lb 12  oz (102.4 kg)  11/10/17 228 lb 8 oz (103.6 kg)     Physical exam: Gen: alert, NAD, not ill appearing Pulm: speaks in complete sentences without increased work of breathing Psych: normal mood, normal thought content      Results for orders placed or performed in visit on 11/16/18  Hemoglobin A1c  Result Value Ref Range   Hgb A1c MFr Bld 7.6 (H) 4.6 - 6.5 %  Comprehensive metabolic panel  Result Value Ref Range   Sodium 142 135 - 145 mEq/L   Potassium 3.9 3.5 - 5.1 mEq/L   Chloride 104 96 - 112 mEq/L   CO2 31 19 - 32 mEq/L   Glucose, Bld 125 (H) 70 - 99 mg/dL   BUN 17 6 - 23 mg/dL   Creatinine, Ser 1.21 0.40 - 1.50 mg/dL   Total Bilirubin 1.3 (H) 0.2 - 1.2 mg/dL   Alkaline Phosphatase 82 39 - 117 U/L   AST 51 (H) 0 - 37 U/L   ALT 60 (H) 0 - 53 U/L   Total Protein 6.7 6.0 - 8.3 g/dL   Albumin 4.4 3.5 - 5.2 g/dL   Calcium 8.7 8.4 - 10.5 mg/dL   GFR 64.54 >60.00 mL/min  Lipid panel  Result Value Ref Range   Cholesterol 121 0 - 200 mg/dL   Triglycerides 217.0 (H) 0.0 - 149.0 mg/dL   HDL 30.20 (L) >39.00 mg/dL   VLDL 43.4 (H) 0.0 - 40.0 mg/dL   Total CHOL/HDL Ratio 4    NonHDL 90.98   LDL cholesterol, direct  Result Value Ref Range   Direct LDL 61.0 mg/dL   Assessment & Plan:   Problem List Items Addressed This Visit    Uncontrolled type 2 diabetes mellitus with complication (HCC)    Chronic, deteriorated by A1c. Encouraged continued efforts at diabetic diet. Continue actos, metformin. Discussed posible option of GLP1 RA weekly injections. RTC 6 mo f/u visit and if A1c remains above 7, will start trulicity/ozempic.       Obesity, Class I, BMI 30-34.9    Encouraged ongoing healthy diet and lifestyle changes to affect sustainable weight loss.      NAFLD (nonalcoholic fatty liver disease)    LFTs elevated again. Continue weight loss efforts.       Internal hemorrhoid    Ongoing flare. Has seen GI s/p procedure with recurrence. Reviewed high fiber, water in diet,  suggested soluble fiber supplement like benefiber or metamucil.       Hypertension    Chronic, stable. Continue current regimen.       Health maintenance examination - Primary    Preventative protocols reviewed and updated unless pt declined. Discussed healthy diet and lifestyle.       Dyslipidemia    Chronic, mildly deteriorated with trig elevation, HDL remains low. LDL great control on rosuvastatin.  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  No orders of the defined types were placed in this encounter.   I discussed the assessment and treatment plan with the patient. The patient was provided an opportunity to ask questions and all were answered. The patient agreed with the plan and demonstrated an understanding of the instructions. The patient was advised to call back or seek an in-person evaluation if the symptoms worsen or if the condition fails to improve as anticipated.  Follow up plan: Return in about 6 months (around 06/02/2019) for follow up visit.  Ria Bush, MD

## 2018-11-30 NOTE — Assessment & Plan Note (Signed)
Preventative protocols reviewed and updated unless pt declined. Discussed healthy diet and lifestyle.  

## 2018-11-30 NOTE — Assessment & Plan Note (Signed)
Encouraged ongoing healthy diet and lifestyle changes to affect sustainable weight loss.  

## 2018-11-30 NOTE — Assessment & Plan Note (Signed)
Ongoing flare. Has seen GI s/p procedure with recurrence. Reviewed high fiber, water in diet, suggested soluble fiber supplement like benefiber or metamucil.

## 2018-11-30 NOTE — Assessment & Plan Note (Signed)
Chronic, deteriorated by A1c. Encouraged continued efforts at diabetic diet. Continue actos, metformin. Discussed posible option of GLP1 RA weekly injections. RTC 6 mo f/u visit and if A1c remains above 7, will start trulicity/ozempic.

## 2018-11-30 NOTE — Assessment & Plan Note (Signed)
LFTs elevated again. Continue weight loss efforts.

## 2018-11-30 NOTE — Assessment & Plan Note (Signed)
Chronic, stable. Continue current regimen. 

## 2019-01-02 ENCOUNTER — Other Ambulatory Visit: Payer: Self-pay | Admitting: Family Medicine

## 2019-01-11 IMAGING — US US RENAL
2 series · 14 of 25 positions shown · non-contrast
Comparison: Abdominal ultrasound 09/25/2015 and 07/04/2008.

CLINICAL DATA: History of left renal cyst.  Follow-up exam.

EXAM:
RENAL / URINARY TRACT ULTRASOUND COMPLETE

[Series 1: us renal · 0.26mm/px · 13 of 39 slices shown (1 of 2)]
[im 1/39]
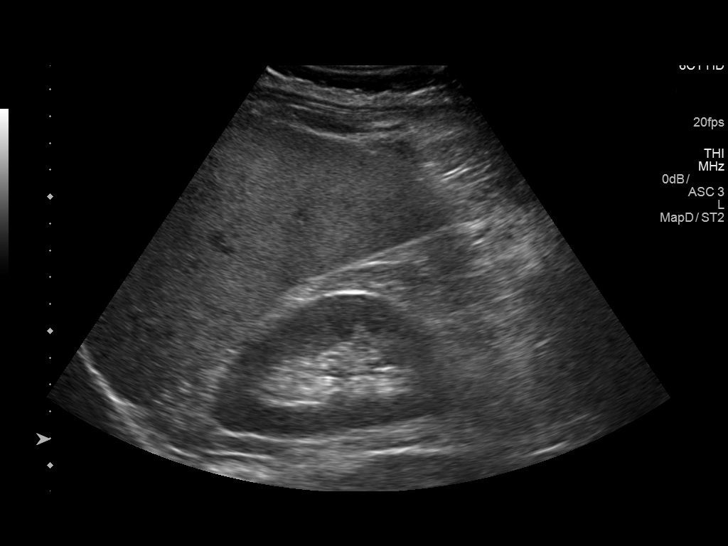
[im 4/39]
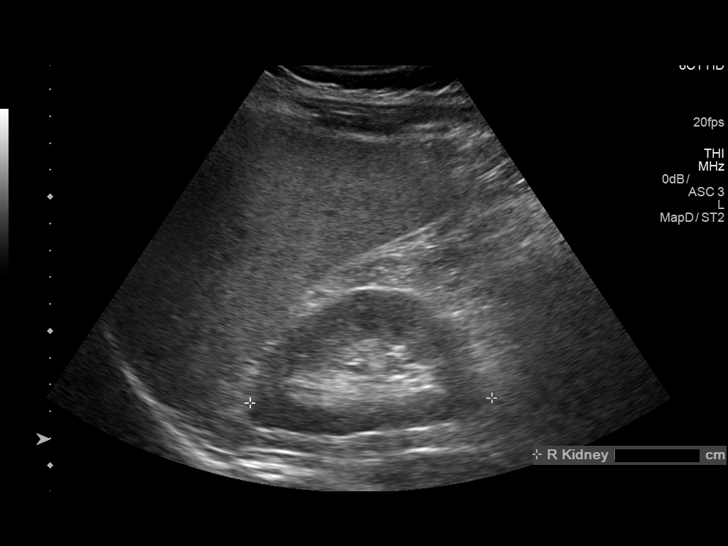
[im 7/39]
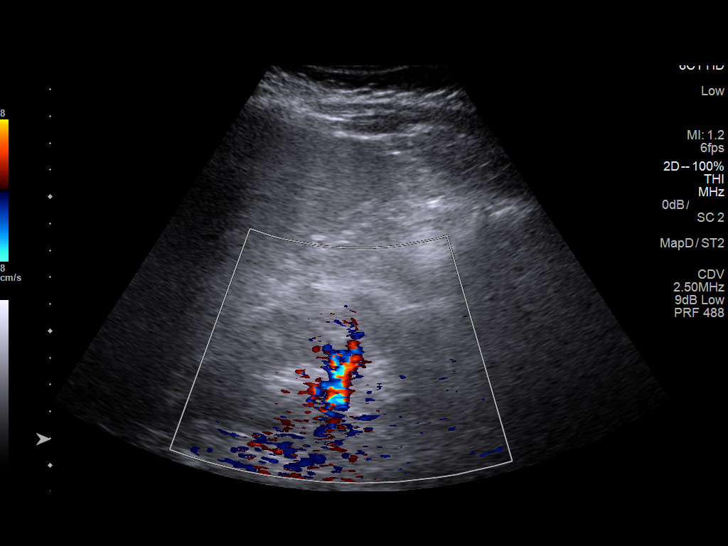
[im 10/39]
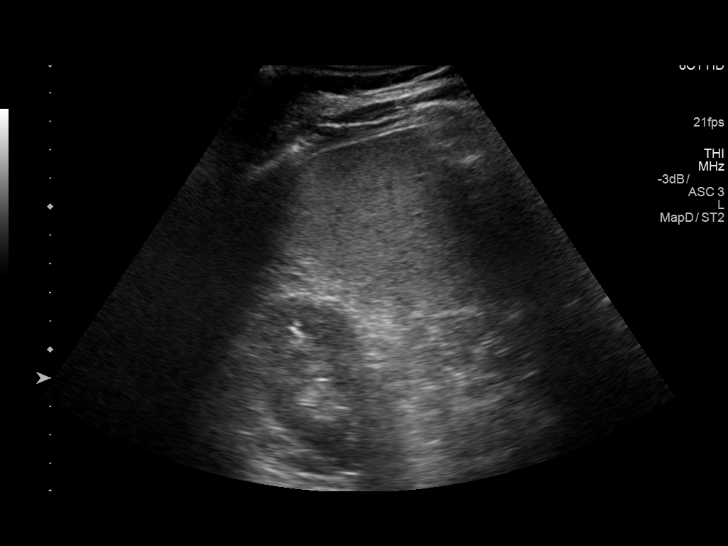
[im 14/39]
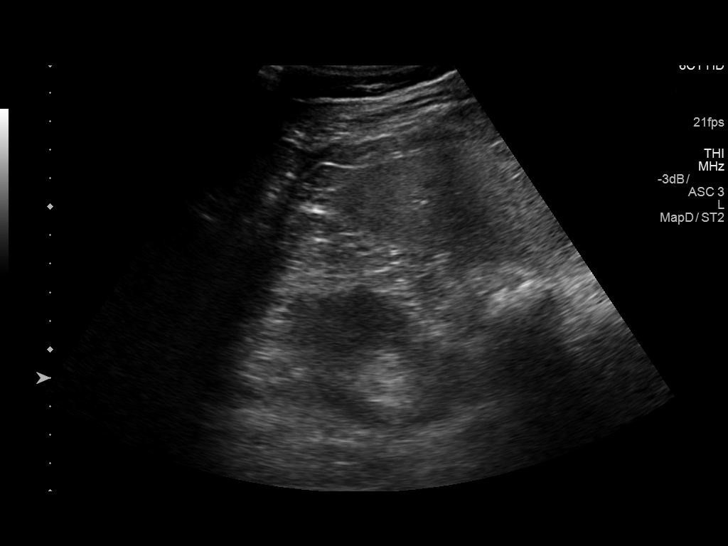
[im 15/39]
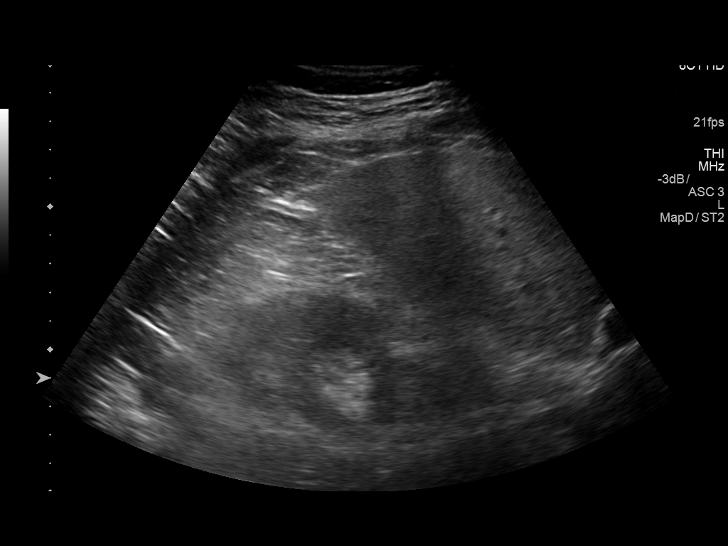
[im 19/39]
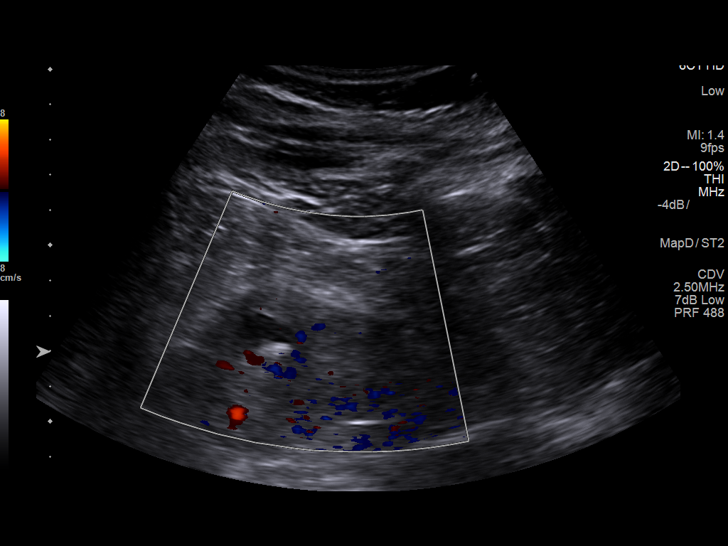
[im 22/39]
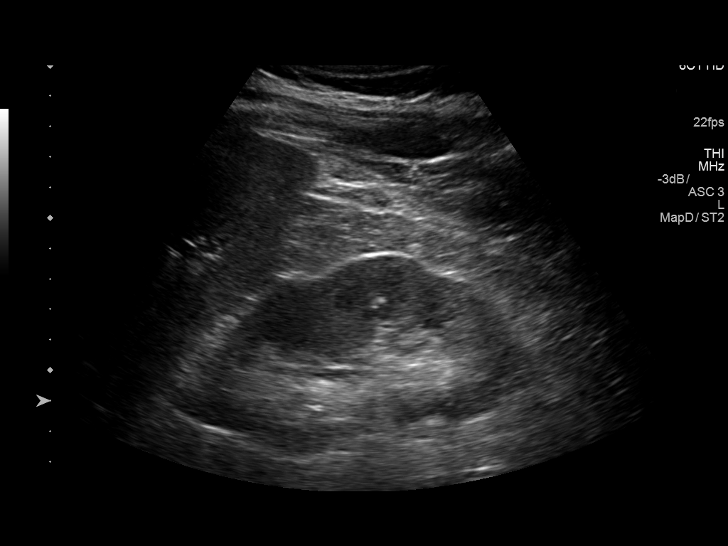
[im 25/39]
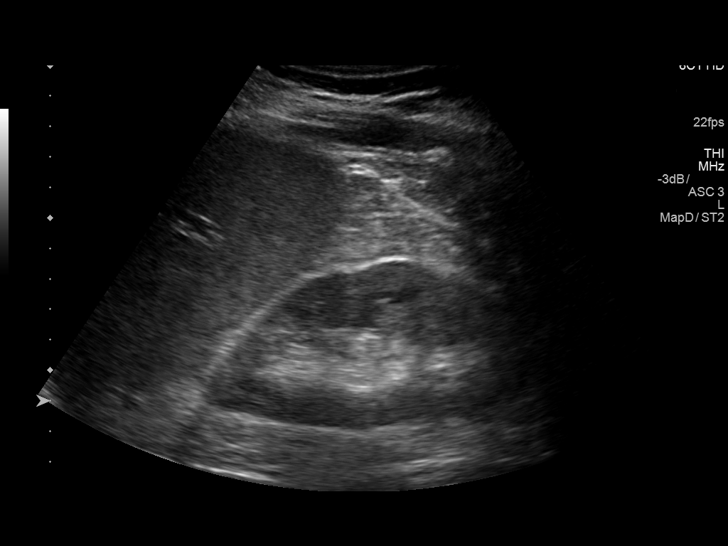
[im 27/39]
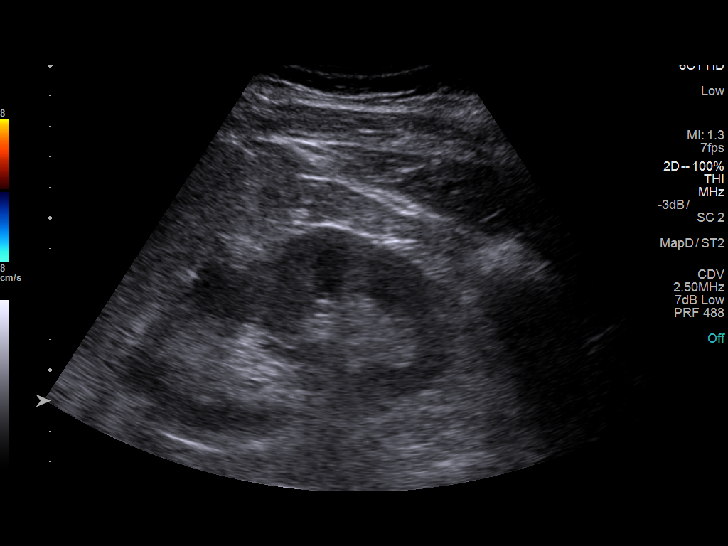
[im 30/39]
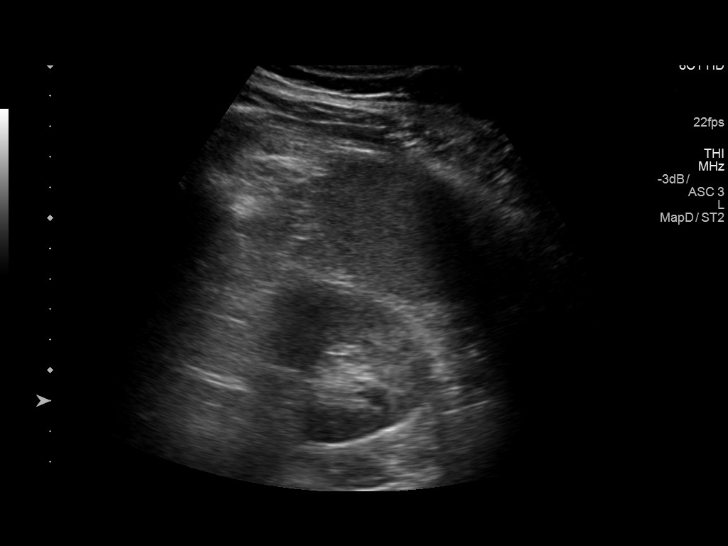
[im 34/39]
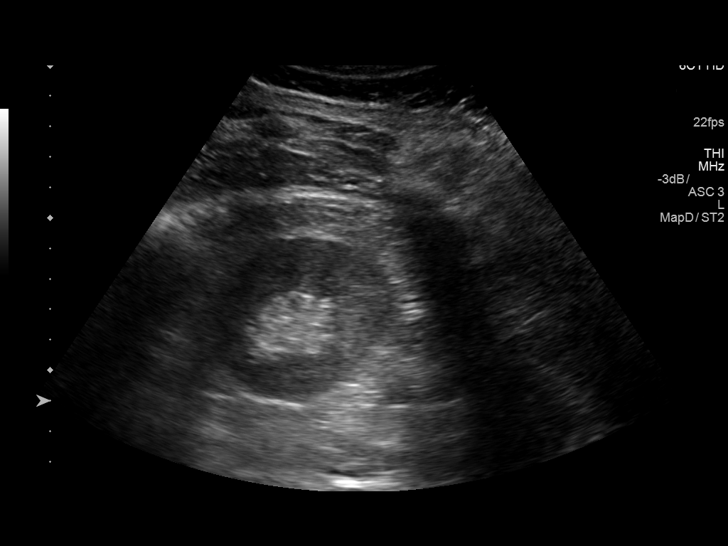
[im 37/39]
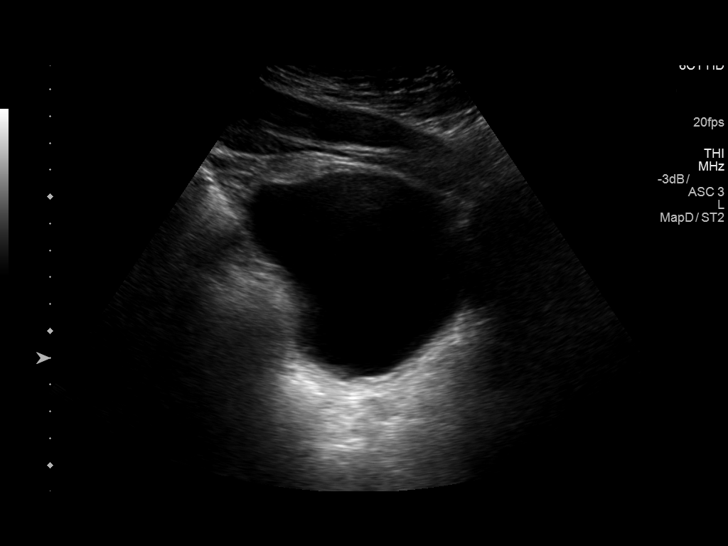

[Series 2001: us renal · 0.19mm/px · 1 of 1 slices shown (2 of 2)]
[im 1/1]
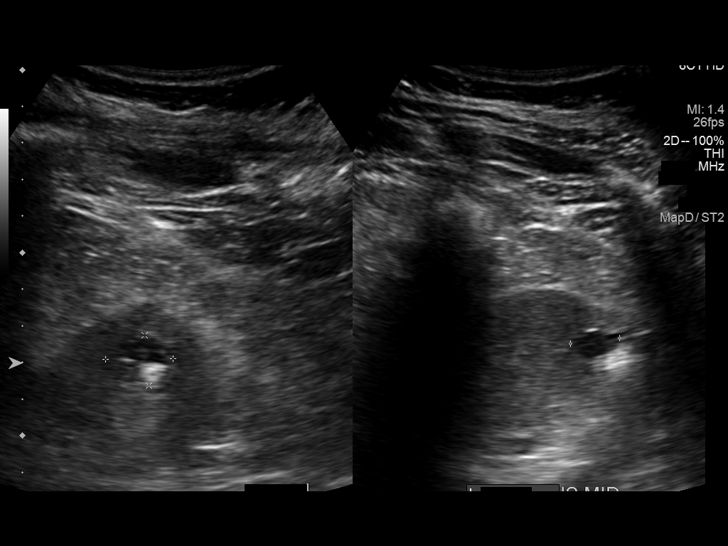

[14 of 25 positions shown; findings below may reference images not displayed]

FINDINGS: Right Kidney:

Length: 9.2 cm. Echogenicity within normal limits. No mass or
hydronephrosis visualized.

Left Kidney:

Length: 12.0 cm. Echogenicity within normal limits. No
hydronephrosis visualized. A cystic lesion in the midpole measures
1.9 x 1.4 x 1.4 cm. There are dependent foci of increased
echogenicity within the lesion. The lesion is not grossly changed in
size or appearance since 0919.

Bladder:

Appears normal for degree of bladder distention.
IMPRESSION: Chronic cystic lesion with dependent internal echoes likely
reflecting calcifications is essentially stable since 0919. No
follow-up imaging is recommended.

## 2019-03-29 DIAGNOSIS — Z6833 Body mass index (BMI) 33.0-33.9, adult: Secondary | ICD-10-CM | POA: Diagnosis not present

## 2019-03-29 DIAGNOSIS — G4733 Obstructive sleep apnea (adult) (pediatric): Secondary | ICD-10-CM | POA: Diagnosis not present

## 2019-04-08 ENCOUNTER — Encounter: Payer: Self-pay | Admitting: Family Medicine

## 2019-04-08 ENCOUNTER — Telehealth: Payer: Self-pay | Admitting: Family Medicine

## 2019-04-08 DIAGNOSIS — G4733 Obstructive sleep apnea (adult) (pediatric): Secondary | ICD-10-CM | POA: Insufficient documentation

## 2019-04-08 DIAGNOSIS — E669 Obesity, unspecified: Secondary | ICD-10-CM

## 2019-04-08 NOTE — Telephone Encounter (Signed)
Received note from ENT Michael Mcconnell who recommended updated sleep study prior to considering a sleep device through ENT. So I have placed new referral.  Previously saw [facility].

## 2019-04-09 NOTE — Telephone Encounter (Signed)
Left message for patient to call Rosaria Ferries back at [phone].

## 2019-04-16 ENCOUNTER — Ambulatory Visit: Payer: BC Managed Care – PPO | Admitting: Psychology

## 2019-05-01 ENCOUNTER — Encounter: Payer: Self-pay | Admitting: Pulmonary Disease

## 2019-05-01 ENCOUNTER — Other Ambulatory Visit: Payer: Self-pay

## 2019-05-01 ENCOUNTER — Ambulatory Visit (INDEPENDENT_AMBULATORY_CARE_PROVIDER_SITE_OTHER): Payer: BC Managed Care – PPO | Admitting: Pulmonary Disease

## 2019-05-01 DIAGNOSIS — G4733 Obstructive sleep apnea (adult) (pediatric): Secondary | ICD-10-CM

## 2019-05-01 NOTE — Addendum Note (Signed)
Addended by: Nena Polio on: 05/01/2019 10:16 AM   Modules accepted: Orders

## 2019-05-01 NOTE — Progress Notes (Signed)
Subjective:    Patient ID: Michael Mcconnell, male    DOB: 10-Sep-1970, 48 y.o.   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  He could not tolerate this and had to return it after 3 months.  He was getting less rest with the CPAP device and had to take sleep aids and even with that could not tolerate.  He did not tolerate nasal interface.  He has seen ENT Dr. Wilburn Cornelia and discussed inspire device and was asked to get reevaluated for OSA. Epworth sleepiness score is six and he denies excessive drowsiness during the  workday or while driving.  Has witnessed apneas and loud snoring. Bedtime is between 830 and 9 PM, sleep latency is 15 to 20 minutes, he sleeps on his side on his stomach with two pillows, reports 1-2 nocturnal awakenings including nocturia and is out of bed by 3:40 AM on weekday.  He has a 30-minute drive to work and does not feel drowsy.  He feels somewhat refreshed, there is no dryness of mouth or morning headaches.  He will stay in bed until 7 AM on weekends and denies naps.  He has gained about 10 pounds since his last study There is no history suggestive of cataplexy, sleep paralysis or parasomnias  Denies excessive use of caffeinated beverages.  He quit smoking 4 years ago smoked about a pack per week, less than 10 pack years    Past Medical History:  Diagnosis Date  . Cyst of left kidney 09/16/2015   On [place] 2010 - stable on rpt [place] 2017. Consider dedicated MRI to further characterize.   . Diabetes type 2, controlled ([place])   . HLD (hyperlipidemia)   . HTN (hypertension)   . NAFLD (nonalcoholic fatty liver disease) 2010, 2017   abd [place], viral hep panel neg  . Sleep apnea 2012   CPAP 4 hours/night - prior saw [facility] physicians     Past Surgical History:  Procedure Laterality  Date  . abd [place]  06/2008   echogenic, ?fatty infiltration, ?L renal focus interpolar region  . HEMORRHOID BANDING  2016   x3 (Medoff)  . NO PAST SURGERIES      Allergies  Allergen Reactions  . Aspirin Nausea Only  . Lisinopril Other (See Comments)    HA     Social History   Socioeconomic History  . Marital status: Married    Spouse name: Not on file  . Number of children: Not on file  . Years of education: Not on file  . Highest education level: Not on file  Occupational History  . Not on file  Social Needs  . Financial resource strain: Not on file  . Food insecurity    Worry: Not on file    Inability: Not on file  . Transportation needs    Medical: Not on file    Non-medical: Not on file  Tobacco Use  . Smoking status: Former Smoker    Quit date: 05/23/2006    Years since quitting: 12.9  . Smokeless tobacco: Never Used  . Tobacco comment: Socially  Substance and Sexual Activity  . Alcohol use: No  . Drug use: No  . Sexual activity: Yes  Lifestyle  . Physical activity    Days per week: Not on file    Minutes  per session: Not on file  . Stress: Not on file  Relationships  . Social Herbalist on phone: Not on file    Gets together: Not on file    Attends religious service: Not on file    Active member of club or organization: Not on file    Attends meetings of clubs or organizations: Not on file    Relationship status: Not on file  . Intimate partner violence    Fear of current or ex partner: Not on file    Emotionally abused: Not on file    Physically abused: Not on file    Forced sexual activity: Not on file  Other Topics Concern  . Not on file  Social History Narrative   Caffeine: 1-2 cups/day   Lives with step daughter, wife, 2 sons, 2 dogs   Occupation: Librarian, academic for [employer]   Activity: no regular exercise   Diet: good water, not great fruit, good vegetables, red meat 3x/wk, fish 2x/wk, fast food 3x/wk     Family History   Problem Relation Age of Onset  . Coronary artery disease Father 61       MI x2  . Hypertension Father   . Hyperlipidemia Father   . Alcohol abuse Father   . Hypertension Mother   . Stroke Maternal Aunt   . Diabetes Maternal Grandfather   . Stroke Maternal Grandfather   . Diabetes Paternal Grandmother   . Stroke Paternal Grandmother   . Cancer Neg Hx     Review of Systems Constitutional: negative for anorexia, fevers and sweats  Eyes: negative for irritation, redness and visual disturbance  Ears, nose, mouth, throat, and face: negative for earaches, epistaxis, nasal congestion and sore throat  Respiratory: negative for cough, dyspnea on exertion, sputum and wheezing  Cardiovascular: negative for chest pain, dyspnea, lower extremity edema, orthopnea, palpitations and syncope  Gastrointestinal: negative for abdominal pain, constipation, diarrhea, melena, nausea and vomiting  Genitourinary:negative for dysuria, frequency and hematuria  Hematologic/lymphatic: negative for bleeding, easy bruising and lymphadenopathy  Musculoskeletal:negative for arthralgias, muscle weakness and stiff joints  Neurological: negative for coordination problems, gait problems, headaches and weakness  Endocrine: negative for diabetic symptoms including polydipsia, polyuria and weight loss     Objective:   Physical Exam   Gen. Pleasant, obese, in no distress, normal affect ENT - no pallor,icterus, no post nasal drip, class 2-3 airway Neck: No JVD, no thyromegaly, no carotid bruits Lungs: no use of accessory muscles, no dullness to percussion, decreased without rales or rhonchi  Cardiovascular: Rhythm regular, heart sounds  normal, no murmurs or gallops, no peripheral edema Abdomen: soft and non-tender, no hepatosplenomegaly, BS normal. Musculoskeletal: No deformities, no cyanosis or clubbing Neuro:  alert, non focal, no tremors        Assessment & Plan:

## 2019-05-01 NOTE — Assessment & Plan Note (Signed)
We will try to obtain his old sleep studies Given excessive daytime somnolence, narrow pharyngeal exam, witnessed apneas & loud snoring, obstructive sleep apnea is very likely & an overnight polysomnogram will be scheduled as a home study. The pathophysiology of obstructive sleep apnea , it's cardiovascular consequences & modes of treatment including CPAP were discused with the patient in detail & they evidenced understanding.  He does not seem to be exceedingly sleepy but does have a high risk cardiovascular profile with diabetes and hypertension.  If he has moderate disease may be a candidate for dental appliance but if he has severe disease and options would be a retrial of CPAP versus inspire device.  His BMI is slightly high but he would make the 32 cut off for inspire

## 2019-05-01 NOTE — Patient Instructions (Addendum)
Schedule home sleep test Based on this we will advise regarding treatment with CPAP versus dental appliance versus inspire device Obtain studies from [facility] clinic-Dr. Polite

## 2019-05-03 ENCOUNTER — Ambulatory Visit (INDEPENDENT_AMBULATORY_CARE_PROVIDER_SITE_OTHER): Payer: BC Managed Care – PPO | Admitting: Psychology

## 2019-05-03 DIAGNOSIS — F3289 Other specified depressive episodes: Secondary | ICD-10-CM

## 2019-05-31 ENCOUNTER — Ambulatory Visit (INDEPENDENT_AMBULATORY_CARE_PROVIDER_SITE_OTHER): Payer: BC Managed Care – PPO | Admitting: Psychology

## 2019-05-31 ENCOUNTER — Ambulatory Visit: Payer: BC Managed Care – PPO | Admitting: Family Medicine

## 2019-05-31 DIAGNOSIS — F3289 Other specified depressive episodes: Secondary | ICD-10-CM

## 2019-06-07 ENCOUNTER — Other Ambulatory Visit: Payer: Self-pay

## 2019-06-07 ENCOUNTER — Ambulatory Visit: Payer: BC Managed Care – PPO | Admitting: Family Medicine

## 2019-06-07 ENCOUNTER — Encounter: Payer: Self-pay | Admitting: Family Medicine

## 2019-06-07 VITALS — BP 124/78 | HR 92 | Temp 98.1°F | Ht 69.25 in | Wt 226.2 lb

## 2019-06-07 DIAGNOSIS — E669 Obesity, unspecified: Secondary | ICD-10-CM

## 2019-06-07 DIAGNOSIS — R4589 Other symptoms and signs involving emotional state: Secondary | ICD-10-CM

## 2019-06-07 DIAGNOSIS — E1165 Type 2 diabetes mellitus with hyperglycemia: Secondary | ICD-10-CM | POA: Diagnosis not present

## 2019-06-07 DIAGNOSIS — E118 Type 2 diabetes mellitus with unspecified complications: Secondary | ICD-10-CM | POA: Diagnosis not present

## 2019-06-07 DIAGNOSIS — IMO0002 Reserved for concepts with insufficient information to code with codable children: Secondary | ICD-10-CM

## 2019-06-07 LAB — POCT GLYCOSYLATED HEMOGLOBIN (HGB A1C): Hemoglobin A1C: 8.1 % — AB (ref 4.0–5.6)

## 2019-06-07 MED ORDER — CITALOPRAM HYDROBROMIDE 10 MG PO TABS: 10.0000 mg | ORAL_TABLET | Freq: Every day | ORAL | 6 refills | Status: DC

## 2019-06-07 MED ORDER — OZEMPIC (0.25 OR 0.5 MG/DOSE) 2 MG/1.5ML ~~LOC~~ SOPN: PEN_INJECTOR | SUBCUTANEOUS | 3 refills | Status: DC

## 2019-06-07 NOTE — Progress Notes (Signed)
This visit was conducted in person.  BP 124/78 (BP Location: Left Arm, Patient Position: Sitting, Cuff Size: Large)   Pulse 92   Temp 98.1 F (36.7 C) (Temporal)   Ht 5' 9.25" (1.759 m)   Wt 226 lb 3 oz (102.6 kg)   SpO2 95%   BMI 33.16 kg/m    CC: 6 mo DM f/u visit Subjective:    Patient ID: Michael Mcconnell, male    DOB: 1970-06-17, 49 y.o.   MRN: [id number]  HPI: Michael Mcconnell is a 49 y.o. male presenting on 06/07/2019 for Diabetes (Here for 6 mo f/u.)   Lost 10 lbs - healthy diet changes, then gained it back.   DM - does regularly check sugars 120-130 fasting. Stopped metformin and actos due to concerns over mental fogginess. After 2 months off these meds, noted improved cognition. Denies low sugars or hypoglycemic symptoms. Denies paresthesias. Last diabetic eye exam 05/2018 - has appt today [facility]). Pneumovax: 2017. Prevnar: not due. Glucometer brand: one-touch. DSME: completed 06/2017. No fmhx thyroid cancer.  Lab Results  Component Value Date   HGBA1C 8.1 (A) 06/07/2019   Diabetic Foot Exam - Simple   Simple Foot Form Diabetic Foot exam was performed with the following findings: Yes 06/07/2019 10:16 AM  Visual Inspection No deformities, no ulcerations, no other skin breakdown bilaterally: Yes Sensation Testing Intact to touch and monofilament testing bilaterally: Yes Pulse Check Posterior Tibialis and Dorsalis pulse intact bilaterally: Yes Comments    Lab Results  Component Value Date   MICROALBUR 5.2 (H) 09/11/2015     Seeing [facility] counselor for new depressed mood - pt notes increased frustration, irritability, with family and coworkers. Ongoing for the past 6-7 months. With counseling, marital communication has improved. Increased stress at work with covid. No sleep trouble. No trouble concentrating. Energy levels ok. No SI/HI. Given longevity of symptoms, would be interested in medication to help.      Relevant past medical, surgical, family and social  history reviewed and updated as indicated. Interim medical history since our last visit reviewed. Allergies and medications reviewed and updated. Outpatient Medications Prior to Visit  Medication Sig Dispense Refill  . Blood Glucose Monitoring Suppl (ONE TOUCH ULTRA MINI) w/Device KIT Use as directed 1 each 0  . glucose blood (ONE TOUCH ULTRA TEST) test strip Once daily and as needed 100 each 3  . hydrochlorothiazide (HYDRODIURIL) 25 MG tablet TAKE 1 TABLET DAILY 90 tablet 3  . ibuprofen (ADVIL,MOTRIN) 600 MG tablet Take 1 tablet (600 mg total) by mouth every 8 (eight) hours as needed. 30 tablet 0  . losartan (COZAAR) 50 MG tablet TAKE 1 TABLET DAILY 90 tablet 3  . Multiple Vitamins-Minerals (MULTIVITAMIN PO) Take 1 capsule by mouth daily.    . rosuvastatin (CRESTOR) 10 MG tablet TAKE 1 TABLET DAILY (NEW   DIRECTIONS) 90 tablet 3  . sildenafil (VIAGRA) 50 MG tablet Take 1 tablet (50 mg total) daily as needed by mouth for erectile dysfunction. 10 tablet 1  . pioglitazone (ACTOS) 15 MG tablet Take 1 tablet (15 mg total) by mouth daily. 90 tablet 1  . metFORMIN (GLUCOPHAGE-XR) 500 MG 24 hr tablet TAKE 2 TABLETS TWICE A DAY (Patient not taking: Reported on 06/07/2019) 360 tablet 3   No facility-administered medications prior to visit.     Per HPI unless specifically indicated in ROS section below Review of Systems Objective:    BP 124/78 (BP Location: Left Arm, Patient Position: Sitting, Cuff Size: Large)  Pulse 92   Temp 98.1 F (36.7 C) (Temporal)   Ht 5' 9.25" (1.759 m)   Wt 226 lb 3 oz (102.6 kg)   SpO2 95%   BMI 33.16 kg/m   Wt Readings from Last 3 Encounters:  06/07/19 226 lb 3 oz (102.6 kg)  05/01/19 220 lb 9.6 oz (100.1 kg)  11/30/18 228 lb (103.4 kg)    Physical Exam Vitals and nursing note reviewed.  Constitutional:      General: He is not in acute distress.    Appearance: He is well-developed.  HENT:     Head: Normocephalic and atraumatic.     Right Ear: External ear  normal.     Left Ear: External ear normal.     Nose: Nose normal.     Mouth/Throat:     Pharynx: No oropharyngeal exudate.  Eyes:     General: No scleral icterus.    Conjunctiva/sclera: Conjunctivae normal.     Pupils: Pupils are equal, round, and reactive to light.  Cardiovascular:     Rate and Rhythm: Normal rate and regular rhythm.     Heart sounds: Normal heart sounds. No murmur.  Pulmonary:     Effort: Pulmonary effort is normal. No respiratory distress.     Breath sounds: Normal breath sounds. No wheezing or rales.  Musculoskeletal:     Cervical back: Normal range of motion and neck supple.     Comments: See HPI for foot exam if done  Lymphadenopathy:     Cervical: No cervical adenopathy.  Skin:    General: Skin is warm and dry.     Findings: No rash.       Results for orders placed or performed in visit on 06/07/19  POCT glycosylated hemoglobin (Hb A1C)  Result Value Ref Range   Hemoglobin A1C 8.1 (A) 4.0 - 5.6 %   HbA1c POC (<> result, manual entry)     HbA1c, POC (prediabetic range)     HbA1c, POC (controlled diabetic range)     Depression screen [facility] 2/9 06/07/2019 11/10/2017  Decreased Interest 2 0  Down, Depressed, Hopeless 1 0  PHQ - 2 Score 3 0  Altered sleeping 0 -  Tired, decreased energy 0 -  Change in appetite 1 -  Feeling bad or failure about yourself  1 -  Trouble concentrating 0 -  Moving slowly or fidgety/restless 0 -  Suicidal thoughts 0 -  PHQ-9 Score 5 -    GAD 7 : Generalized Anxiety Score 06/07/2019  Nervous, Anxious, on Edge 1  Control/stop worrying 1  Worry too much - different things 1  Trouble relaxing 1  Restless 0  Easily annoyed or irritable 2  Afraid - awful might happen 1  Total GAD 7 Score 7   Assessment & Plan:  This visit occurred during the SARS-CoV-2 public health emergency.  Safety protocols were in place, including screening questions prior to the visit, additional usage of staff PPE, and extensive cleaning of exam room  while observing appropriate contact time as indicated for disinfecting solutions.   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 He had been controlling better with healthy diet and weight loss but has since regained some weight. Motivated to continue to work on regular exercise and diabetic diet in an effort to minimize need for antihyperglycemic. However, also willing to  try new medication. Discussed weekly GLP1 RA and benefits including weight loss and cardiovascular benefit. No fmhx thyroid cancer. Will price out ozempic or trulicity and start. Discussed monitoring for nausea on med as well as when titrating. He can have injection teaching through pharmacy or return for nurse visit for this. RTC 3 mo f/u visit.       Relevant Medications   Semaglutide,0.25 or 0.5MG/DOS, (OZEMPIC, 0.25 OR 0.5 MG/DOSE,) 2 MG/1.5ML SOPN   Other Relevant Orders   POCT glycosylated hemoglobin (Hb A1C) (Completed)   Obesity, Class I, BMI 30-34.9    Continue working on healthy diet and lifestyle to affect sustainable weight loss.       Depressed mood    In setting of pandemic. Low PHQ9, GAD7 scores however given longevity of symptoms reasonable to trial low dose SSRI, anticipate short need. Continue counseling. Discussed monitoring for nausea, HA when starting med. Discussed possible risk of suicidality with any antidepressant commencement. Update with effect.           Meds ordered this encounter  Medications  . Semaglutide,0.25 or 0.5MG/DOS, (OZEMPIC, 0.25 OR 0.5 MG/DOSE,) 2 MG/1.5ML SOPN    Sig: Inject 0.25 mg into the skin once a week for 14 days, THEN 0.5 mg once a week.    Dispense:  1 pen    Refill:  3  . citalopram (CELEXA) 10 MG tablet    Sig: Take 1 tablet (10 mg total) by mouth daily.    Dispense:  30 tablet    Refill:  6   Orders  Placed This Encounter  Procedures  . POCT glycosylated hemoglobin (Hb A1C)    Patient Instructions  Ok to stay off actos and metformin for now.  Price out ozempic (alternative would be trulicity).  Start at 0.18m weekly for 2 weeks then increase to 0.570mweekly if tolerated.  Let me know if any trouble with medicine. Return in 3 months for diabetes follow up visit.  Try celexa 1083maily for mood.    Follow up plan: Return in about 3 months (around 09/05/2019) for follow up visit.  JavRia BushD

## 2019-06-07 NOTE — Patient Instructions (Addendum)
Ok to stay off actos and metformin for now.  Price out ozempic (alternative would be trulicity).  Start at 0.25mg  weekly for 2 weeks then increase to 0.5mg  weekly if tolerated.  Let me know if any trouble with medicine. Return in 3 months for diabetes follow up visit.  Try celexa 10mg  daily for mood.

## 2019-06-08 DIAGNOSIS — F4323 Adjustment disorder with mixed anxiety and depressed mood: Secondary | ICD-10-CM | POA: Insufficient documentation

## 2019-06-08 DIAGNOSIS — R4589 Other symptoms and signs involving emotional state: Secondary | ICD-10-CM | POA: Insufficient documentation

## 2019-06-08 NOTE — Assessment & Plan Note (Addendum)
Chronic, deteriorated, off metformin and actos due to concerns over worsening cognition. He had been controlling better with healthy diet and weight loss but has since regained some weight. Motivated to continue to work on regular exercise and diabetic diet in an effort to minimize need for antihyperglycemic. However, also willing to try new medication. Discussed weekly GLP1 RA and benefits including weight loss and cardiovascular benefit. No fmhx thyroid cancer. Will price out ozempic or trulicity and start. Discussed monitoring for nausea on med as well as when titrating. He can have injection teaching through pharmacy or return for nurse visit for this. RTC 3 mo f/u visit.

## 2019-06-08 NOTE — Assessment & Plan Note (Signed)
Continue working on healthy diet and lifestyle to affect sustainable weight loss.

## 2019-06-08 NOTE — Assessment & Plan Note (Signed)
In setting of pandemic. Low PHQ9, GAD7 scores however given longevity of symptoms reasonable to trial low dose SSRI, anticipate short need. Continue counseling. Discussed monitoring for nausea, HA when starting med. Discussed possible risk of suicidality with any antidepressant commencement. Update with effect.

## 2019-06-14 ENCOUNTER — Ambulatory Visit (INDEPENDENT_AMBULATORY_CARE_PROVIDER_SITE_OTHER): Payer: BC Managed Care – PPO | Admitting: Psychology

## 2019-06-14 DIAGNOSIS — F3289 Other specified depressive episodes: Secondary | ICD-10-CM

## 2019-06-28 ENCOUNTER — Ambulatory Visit: Payer: Self-pay | Admitting: Psychology

## 2019-06-28 DIAGNOSIS — H5213 Myopia, bilateral: Secondary | ICD-10-CM | POA: Diagnosis not present

## 2019-06-28 DIAGNOSIS — E119 Type 2 diabetes mellitus without complications: Secondary | ICD-10-CM | POA: Diagnosis not present

## 2019-06-28 DIAGNOSIS — H52203 Unspecified astigmatism, bilateral: Secondary | ICD-10-CM | POA: Diagnosis not present

## 2019-06-28 LAB — HM DIABETES EYE EXAM

## 2019-07-01 ENCOUNTER — Encounter: Payer: Self-pay | Admitting: Family Medicine

## 2019-07-13 ENCOUNTER — Encounter: Payer: Self-pay | Admitting: Family Medicine

## 2019-07-18 MED ORDER — ONETOUCH DELICA LANCETS 33G MISC: 3 refills | Status: DC

## 2019-07-18 MED ORDER — GLUCOSE BLOOD VI STRP: ORAL_STRIP | 3 refills | Status: DC

## 2019-07-18 NOTE — Telephone Encounter (Signed)
E-scribed test strips and lancets.  Notified pt via [place].

## 2019-07-19 ENCOUNTER — Telehealth: Payer: Self-pay

## 2019-07-19 DIAGNOSIS — IMO0002 Reserved for concepts with insufficient information to code with codable children: Secondary | ICD-10-CM

## 2019-07-19 DIAGNOSIS — E1165 Type 2 diabetes mellitus with hyperglycemia: Secondary | ICD-10-CM

## 2019-07-19 MED ORDER — ONETOUCH DELICA LANCETS 30G MISC: 1.0000 | 0 refills | Status: DC

## 2019-07-19 NOTE — Telephone Encounter (Signed)
Received faxed notice from [facility] stating the ordered OneTouch Delica lancets 33 G are on backorder.  Asking to change to 30 G.  Spoke with pt informing him of the backorder.  Pt is ok to change to lancets.  E-scribed new rx.

## 2019-07-26 ENCOUNTER — Encounter: Payer: Self-pay | Admitting: Family Medicine

## 2019-07-26 MED ORDER — LOSARTAN POTASSIUM 50 MG PO TABS: 50.0000 mg | ORAL_TABLET | Freq: Every day | ORAL | 3 refills | Status: DC

## 2019-07-26 MED ORDER — ROSUVASTATIN CALCIUM 10 MG PO TABS: 10.0000 mg | ORAL_TABLET | Freq: Every day | ORAL | 3 refills | Status: DC

## 2019-07-26 MED ORDER — HYDROCHLOROTHIAZIDE 25 MG PO TABS: 25.0000 mg | ORAL_TABLET | Freq: Every day | ORAL | 3 refills | Status: DC

## 2019-08-01 ENCOUNTER — Ambulatory Visit: Payer: BC Managed Care – PPO

## 2019-08-01 ENCOUNTER — Other Ambulatory Visit: Payer: Self-pay

## 2019-08-01 DIAGNOSIS — G4733 Obstructive sleep apnea (adult) (pediatric): Secondary | ICD-10-CM

## 2019-08-06 ENCOUNTER — Telehealth: Payer: Self-pay | Admitting: Pulmonary Disease

## 2019-08-06 DIAGNOSIS — G4733 Obstructive sleep apnea (adult) (pediatric): Secondary | ICD-10-CM | POA: Diagnosis not present

## 2019-08-06 NOTE — Telephone Encounter (Signed)
ATC pt, no answer. Left message for pt to call back.  

## 2019-08-06 NOTE — Telephone Encounter (Signed)
Called pt and advised message from the provider. Pt understood and verbalized understanding. Nothing further is needed.   He did agree to place order for CPAP. Order placed.

## 2019-08-06 NOTE — Telephone Encounter (Signed)
Patient is returning phone call. Patient phone number is [phone].

## 2019-08-06 NOTE — Telephone Encounter (Signed)
HST showed severe OSA, AHI 31/hour Unfortunately dental appliance would not be effective.  His options include CPAP therapy or INSP IRE device I would suggest that he should give CPAP another try-as we had discussed during office visit If willing, proceed with auto CPAP 5 to 15 cm, mask of choice, office visit with me in 6 weeks

## 2019-08-08 DIAGNOSIS — G4733 Obstructive sleep apnea (adult) (pediatric): Secondary | ICD-10-CM | POA: Diagnosis not present

## 2019-08-09 ENCOUNTER — Encounter: Payer: Self-pay | Admitting: Family Medicine

## 2019-08-09 NOTE — Telephone Encounter (Signed)
[facility] who reports pt needs two pens per 30 days. They also reported pts first script was $24 but a refill now is going to cost $800.  Contacted pt and he reports if a 90 day supply is sent he will pay $60. This should be sent to [facility]. Advised pt he should continue on the 0.5 weekly now and not return to the 1.25. Advised also to use a new pen if the prior pen does not have 0.5. pt verbalized understanding.    From Dr. Bosie Clos last office note on 06/07/19 Patient Instructions  Ok to stay off actos and metformin for now.  Price out ozempic (alternative would be trulicity).  Start at 0.25mg  weekly for 2 weeks then increase to 0.5mg  weekly if tolerated.  Let me know if any trouble with medicine. Return in 3 months for diabetes follow up visit.  Try celexa 10mg  daily for mood.    Follow up plan: Return in about 3 months (around 09/05/2019) for follow up visit.

## 2019-08-12 MED ORDER — OZEMPIC (0.25 OR 0.5 MG/DOSE) 2 MG/1.5ML ~~LOC~~ SOPN: 0.5000 mg | PEN_INJECTOR | SUBCUTANEOUS | 3 refills | Status: DC

## 2019-08-12 NOTE — Telephone Encounter (Signed)
Pt left v/m that his insurance will cover Ozempic if prescription is for a 90 day supply. Pt request cb.

## 2019-08-17 DIAGNOSIS — G4733 Obstructive sleep apnea (adult) (pediatric): Secondary | ICD-10-CM | POA: Diagnosis not present

## 2019-08-20 DIAGNOSIS — G4733 Obstructive sleep apnea (adult) (pediatric): Secondary | ICD-10-CM | POA: Diagnosis not present

## 2019-09-01 ENCOUNTER — Other Ambulatory Visit: Payer: Self-pay | Admitting: Family Medicine

## 2019-09-01 DIAGNOSIS — IMO0002 Reserved for concepts with insufficient information to code with codable children: Secondary | ICD-10-CM

## 2019-09-01 DIAGNOSIS — E1165 Type 2 diabetes mellitus with hyperglycemia: Secondary | ICD-10-CM

## 2019-09-05 ENCOUNTER — Other Ambulatory Visit: Payer: Self-pay

## 2019-09-06 ENCOUNTER — Encounter: Payer: Self-pay | Admitting: Family Medicine

## 2019-09-06 ENCOUNTER — Ambulatory Visit: Payer: BC Managed Care – PPO | Admitting: Family Medicine

## 2019-09-06 VITALS — BP 118/70 | HR 99 | Temp 98.0°F | Ht 69.25 in | Wt 220.0 lb

## 2019-09-06 DIAGNOSIS — E118 Type 2 diabetes mellitus with unspecified complications: Secondary | ICD-10-CM

## 2019-09-06 DIAGNOSIS — E669 Obesity, unspecified: Secondary | ICD-10-CM | POA: Diagnosis not present

## 2019-09-06 DIAGNOSIS — I10 Essential (primary) hypertension: Secondary | ICD-10-CM | POA: Diagnosis not present

## 2019-09-06 DIAGNOSIS — G4733 Obstructive sleep apnea (adult) (pediatric): Secondary | ICD-10-CM | POA: Diagnosis not present

## 2019-09-06 DIAGNOSIS — E66811 Obesity, class 1: Secondary | ICD-10-CM

## 2019-09-06 LAB — POCT GLYCOSYLATED HEMOGLOBIN (HGB A1C): Hemoglobin A1C: 6.4 % — AB (ref 4.0–5.6)

## 2019-09-06 NOTE — Patient Instructions (Addendum)
You're doing great!  Continue current medicines. Congratulations on better sugar control!  The 15-15 rule for low sugars: If sugar reading below 70, have 15 grams of carbohydrate to raise your blood sugar and check it after 15 minutes. If it's still below 70 mg/dL, have another serving. 15 grams of carbs may be: -Glucose tablets (see instructions) -Gel tube (see instructions) -4 ounces (1/2 cup) of juice or regular soda (not diet) -1 tablespoon of sugar, honey, or corn syrup -Hard candies, jellybeans or gumdrops--see food label for how many to consume  Repeat these steps until your blood sugar is at least 70 mg/dL. Once your blood sugar is back to normal, eat a meal or snack to make sure it doesn't lower again.

## 2019-09-06 NOTE — Assessment & Plan Note (Signed)
Now back on CPAP with benefit. Appreciate Dr Bari Mantis care.

## 2019-09-06 NOTE — Progress Notes (Signed)
This visit was conducted in person.  BP 118/70 (BP Location: Right Arm, Patient Position: Sitting, Cuff Size: Large)   Pulse 99   Temp 98 F (36.7 C) (Temporal)   Ht 5' 9.25" (1.759 m)   Wt 220 lb (99.8 kg)   SpO2 98%   BMI 32.25 kg/m   BP Readings from Last 3 Encounters:  09/06/19 118/70  06/07/19 124/78  05/01/19 126/78    CC: 3 mo DM f/u visit  Subjective:    Patient ID: Michael Mcconnell, male    DOB: December 25, 1970, 49 y.o.   MRN: [id number]  HPI: Michael Mcconnell is a 49 y.o. male presenting on 09/06/2019 for Diabetes (Here for 3 mo f/u.)   DM - does regularly check sugars 90-100s fasting. Compliant with antihyperglycemic regimen which includes: ozempic 0.4m weekly. He had trouble with metformin - mental fogginess, trouble digesting. Denies low sugars or hypoglycemic symptoms. Denies paresthesias. Last diabetic eye exam 06/2019. Pneumovax: 2017. Prevnar: not due. Glucometer brand: onetouch. [facility]  Lab Results  Component Value Date   HGBA1C 6.4 (A) 09/06/2019   Diabetic Foot Exam - Simple   No data filed     Lab Results  Component Value Date   MICROALBUR 5.2 (H) 09/11/2015     OSA - has restarted ResMed CPAP, saw Dr AElsworth Soho Tolerating much better. Notes more restful sleep.      Relevant past medical, surgical, family and social history reviewed and updated as indicated. Interim medical history since our last visit reviewed. Allergies and medications reviewed and updated. Outpatient Medications Prior to Visit  Medication Sig Dispense Refill  . Blood Glucose Monitoring Suppl (ONE TOUCH ULTRA MINI) w/Device KIT Use as directed 1 each 0  . citalopram (CELEXA) 10 MG tablet Take 1 tablet (10 mg total) by mouth daily. 30 tablet 6  . glucose blood (ONE TOUCH ULTRA TEST) test strip Once daily and as needed 100 each 3  . hydrochlorothiazide (HYDRODIURIL) 25 MG tablet Take 1 tablet (25 mg total) by mouth daily. 90 tablet 3  . ibuprofen (ADVIL,MOTRIN) 600  MG tablet Take 1 tablet (600 mg total) by mouth every 8 (eight) hours as needed. 30 tablet 0  . losartan (COZAAR) 50 MG tablet Take 1 tablet (50 mg total) by mouth daily. 90 tablet 3  . Multiple Vitamins-Minerals (MULTIVITAMIN PO) Take 1 capsule by mouth daily.    .Glory RosebushDelica Lancets 340JMISC 1 EACH BY DOES NOT APPLY ROUTE AS DIRECTED. USE AS INSTRUCTED TO CHECK BLOOD SUGAR ONCE DAILY 100 each 11  . rosuvastatin (CRESTOR) 10 MG tablet Take 1 tablet (10 mg total) by mouth daily. 90 tablet 3  . Semaglutide,0.25 or 0.5MG/DOS, (OZEMPIC, 0.25 OR 0.5 MG/DOSE,) 2 MG/1.5ML SOPN Inject 0.5 mg into the skin once a week. 3 pen 3  . sildenafil (VIAGRA) 50 MG tablet Take 1 tablet (50 mg total) daily as needed by mouth for erectile dysfunction. 10 tablet 1   No facility-administered medications prior to visit.     Per HPI unless specifically indicated in ROS section below Review of Systems Objective:    BP 118/70 (BP Location: Right Arm, Patient Position: Sitting, Cuff Size: Large)   Pulse 99   Temp 98 F (36.7 C) (Temporal)   Ht 5' 9.25" (1.759 m)   Wt 220 lb (99.8 kg)   SpO2 98%   BMI 32.25 kg/m   Wt Readings from Last 3 Encounters:  09/06/19 220 lb (99.8 kg)  06/07/19  226 lb 3 oz (102.6 kg)  05/01/19 220 lb 9.6 oz (100.1 kg)    Physical Exam Vitals and nursing note reviewed.  Constitutional:      General: He is not in acute distress.    Appearance: Normal appearance. He is well-developed. He is not ill-appearing.  HENT:     Head: Normocephalic and atraumatic.     Right Ear: External ear normal.     Left Ear: External ear normal.     Nose: Nose normal.     Mouth/Throat:     Pharynx: No oropharyngeal exudate.  Eyes:     General: No scleral icterus.    Conjunctiva/sclera: Conjunctivae normal.     Pupils: Pupils are equal, round, and reactive to light.  Cardiovascular:     Rate and Rhythm: Normal rate and regular rhythm.     Pulses: Normal pulses.     Heart sounds: Normal heart  sounds. No murmur.  Pulmonary:     Effort: Pulmonary effort is normal. No respiratory distress.     Breath sounds: Normal breath sounds. No wheezing, rhonchi or rales.  Musculoskeletal:     Cervical back: Normal range of motion and neck supple.     Comments: See HPI for foot exam if done  Lymphadenopathy:     Cervical: No cervical adenopathy.  Skin:    General: Skin is dry.  Neurological:     Mental Status: He is alert.       Results for orders placed or performed in visit on 09/06/19  POCT glycosylated hemoglobin (Hb A1C)  Result Value Ref Range   Hemoglobin A1C 6.4 (A) 4.0 - 5.6 %   HbA1c POC (<> result, manual entry)     HbA1c, POC (prediabetic range)     HbA1c, POC (controlled diabetic range)     Assessment & Plan:  This visit occurred during the SARS-CoV-2 public health emergency.  Safety protocols were in place, including screening questions prior to the visit, additional usage of staff PPE, and extensive cleaning of exam room while observing appropriate contact time as indicated for disinfecting solutions.   Problem List Items Addressed This Visit    OSA (obstructive sleep apnea)    Now back on CPAP with benefit. Appreciate Dr Bari Mantis care.       Obesity, Class I, BMI 30-34.9    Congratulated on noted weight loss.       Hypertension    Chronic, stable. Continue current regimen.       Controlled diabetes mellitus type 2 with complications ([facility]) - Primary    Chronic, marked improvement! Congratulated. Continue ozempic.  Discussed hypoglycemia management, 15-15 rule handout provided.       Relevant Orders   POCT glycosylated hemoglobin (Hb A1C) (Completed)       No orders of the defined types were placed in this encounter.  Orders Placed This Encounter  Procedures  . POCT glycosylated hemoglobin (Hb A1C)    Patient Instructions  You're doing great!  Continue current medicines. Congratulations on better sugar control!  The 15-15 rule for low  sugars: If sugar reading below 70, have 15 grams of carbohydrate to raise your blood sugar and check it after 15 minutes. If it's still below 70 mg/dL, have another serving. 15 grams of carbs may be: -Glucose tablets (see instructions) -Gel tube (see instructions) -4 ounces (1/2 cup) of juice or regular soda (not diet) -1 tablespoon of sugar, honey, or corn syrup -Hard candies, jellybeans or gumdrops--see food label for how many  to consume  Repeat these steps until your blood sugar is at least 70 mg/dL. Once your blood sugar is back to normal, eat a meal or snack to make sure it doesn't lower again.    Follow up plan: Return in about 3 months (around 12/06/2019) for annual exam, prior fasting for blood work.  Ria Bush, MD

## 2019-09-06 NOTE — Assessment & Plan Note (Signed)
Chronic, stable. Continue current regimen. 

## 2019-09-06 NOTE — Assessment & Plan Note (Signed)
Chronic, marked improvement! Congratulated. Continue ozempic.  Discussed hypoglycemia management, 15-15 rule handout provided.

## 2019-09-06 NOTE — Assessment & Plan Note (Signed)
Congratulated on noted weight loss.

## 2019-09-20 DIAGNOSIS — G4733 Obstructive sleep apnea (adult) (pediatric): Secondary | ICD-10-CM | POA: Diagnosis not present

## 2019-10-20 DIAGNOSIS — G4733 Obstructive sleep apnea (adult) (pediatric): Secondary | ICD-10-CM | POA: Diagnosis not present

## 2019-10-27 ENCOUNTER — Ambulatory Visit (HOSPITAL_COMMUNITY)
Admission: EM | Admit: 2019-10-27 | Discharge: 2019-10-27 | Disposition: A | Discharge status: Home / Self Care | Payer: BC Managed Care – PPO

## 2019-10-27 ENCOUNTER — Encounter (HOSPITAL_COMMUNITY): Payer: Self-pay

## 2019-10-27 DIAGNOSIS — S61012A Laceration without foreign body of left thumb without damage to nail, initial encounter: Secondary | ICD-10-CM | POA: Diagnosis not present

## 2019-10-27 NOTE — Discharge Instructions (Signed)
Leave bandage on for the next 48 hours.  The pad on the cut will felt prevent bleeding

## 2019-10-27 NOTE — ED Triage Notes (Addendum)
PT states was washing an apple slicer and lost grip and it cut his left thumb yesterday. Pt has a dressing on thumb. Bleeding is controlled.

## 2019-10-27 NOTE — ED Provider Notes (Signed)
[place]    CSN: [id number] Arrival date & time: 10/27/19  1015      History   Chief Complaint Chief Complaint  Patient presents with  . Laceration    HPI Michael Mcconnell is a 49 y.o. male.   The history is provided by the patient. No language interpreter was used.  Laceration Location:  Finger Finger laceration location:  L thumb Length:  4m Depth:  Through dermis Quality: straight   Pain details:    Quality:  Aching   Severity:  No pain   Progression:  Worsening Foreign body present:  No foreign bodies Relieved by:  Nothing Worsened by:  Nothing Tetanus status:  Up to date Pt reports he cut edge of finger yesterday around 5pm.  He report area continues to bleed.   Past Medical History:  Diagnosis Date  . Cyst of left kidney 09/16/2015   On [place] - stable on rpt [place]. Consider dedicated MRI to further characterize.   . Diabetes type 2, controlled ([place]   . HLD (hyperlipidemia)   . HTN (hypertension)   . NAFLD (nonalcoholic fatty liver disease) 2010, 2017   abd [place] viral hep panel neg  . Sleep apnea 2012   [facility] 4 hours/night [facility]    Patient Active Problem List   Diagnosis Date Noted  . Depressed mood 06/08/2019  . OSA (obstructive sleep apnea) 04/08/2019  . Diabetes mellitus with cataract ([place] 06/02/2018  . Erectile dysfunction 04/07/2017  . Pre-syncope 01/18/2016  . Left-sided low back pain with sciatica 09/16/2015  . NAFLD (nonalcoholic fatty liver disease) 09/16/2015  . Cyst of left kidney 09/16/2015  . Obesity, Class I, BMI 30-34.9 06/10/2014  . Internal hemorrhoid 01/20/2012  . Hypertension 06/27/2011  . Dyslipidemia 06/27/2011  . Controlled diabetes mellitus type 2 with complications ([place] 035/57/3220 . Health maintenance examination 03/28/2011    Past Surgical History:  Procedure Laterality Date  . abd [place]/2008   echogenic, ?fatty infiltration, ?L renal focus interpolar region  . HEMORRHOID  BANDING  2016   x3 ([place])  . NO PAST SURGERIES         Home Medications    Prior to Admission medications   Medication Sig Start Date End Date Taking? Authorizing Provider  Blood Glucose Monitoring Suppl (ONE TOUCH ULTRA MINI) w/Device KIT Use as directed 07/07/17   GRia Bush MD  citalopram (CELEXA) 10 MG tablet Take 1 tablet (10 mg total) by mouth daily. 06/07/19   GRia Bush MD  glucose blood (ONE TOUCH ULTRA TEST) test strip Once daily and as needed 07/18/19   GRia Bush MD  hydrochlorothiazide (HYDRODIURIL) 25 MG tablet Take 1 tablet (25 mg total) by mouth daily. 07/26/19   GRia Bush MD  ibuprofen (ADVIL,MOTRIN) 600 MG tablet Take 1 tablet (600 mg total) by mouth every 8 (eight) hours as needed. 04/08/13   SJunius Creamer NP  losartan (COZAAR) 50 MG tablet Take 1 tablet (50 mg total) by mouth daily. 07/26/19   GRia Bush MD  Multiple Vitamins-Minerals (MULTIVITAMIN PO) Take 1 capsule by mouth daily.    [provider]  OneTouch Delica Lancets 325KMISC 1 EACH BY DOES NOT APPLY ROUTE AS DIRECTED. USE AS INSTRUCTED TO CHECK BLOOD SUGAR ONCE DAILY 09/02/19   GRia Bush MD  rosuvastatin (CRESTOR) 10 MG tablet Take 1 tablet (10 mg total) by mouth daily. 07/26/19   GRia Bush MD  Semaglutide,0.25 or 0.5MG/DOS, (OZEMPIC, 0.25 OR 0.5  MG/DOSE,) 2 MG/1.5ML SOPN Inject 0.5 mg into the skin once a week. 08/12/19   Ria Bush, MD  sildenafil (VIAGRA) 50 MG tablet Take 1 tablet (50 mg total) daily as needed by mouth for erectile dysfunction. 04/07/17   Ria Bush, MD    Family History Family History  Problem Relation Age of Onset  . Coronary artery disease Father 51       MI x2  . Hypertension Father   . Hyperlipidemia Father   . Alcohol abuse Father   . Hypertension Mother   . Stroke Maternal Aunt   . Diabetes Maternal Grandfather   . Stroke Maternal Grandfather   . Diabetes Paternal Grandmother   . Stroke Paternal  Grandmother   . Cancer Neg Hx     Social History Social History   Tobacco Use  . Smoking status: Former Smoker    Quit date: 05/23/2006    Years since quitting: 13.4  . Smokeless tobacco: Never Used  . Tobacco comment: Socially  Substance Use Topics  . Alcohol use: No  . Drug use: No     Allergies   Aspirin and Lisinopril   Review of Systems Review of Systems  Skin: Positive for wound.  All other systems reviewed and are negative.    Physical Exam Triage Vital Signs ED Triage Vitals  Enc Vitals Group     BP 10/27/19 1122 116/78     Pulse Rate 10/27/19 1122 73     Resp 10/27/19 1122 16     Temp 10/27/19 1122 98.4 F (36.9 C)     Temp Source 10/27/19 1122 Oral     SpO2 10/27/19 1122 98 %     Weight 10/27/19 1125 218 lb (98.9 kg)     Height 10/27/19 1125 '5\' 10"'  (1.778 m)     Head Circumference --      Peak Flow --      Pain Score 10/27/19 1125 3     Pain Loc --      Pain Edu? --      Excl. in [place]? --    No data found.  Updated Vital Signs BP 116/78   Pulse 73   Temp 98.4 F (36.9 C) (Oral)   Resp 16   Ht '5\' 10"'  (1.778 m)   Wt 98.9 kg   SpO2 98%   BMI 31.28 kg/m   Visual Acuity Right Eye Distance:   Left Eye Distance:   Bilateral Distance:    Right Eye Near:   Left Eye Near:    Bilateral Near:     Physical Exam Vitals reviewed.  Musculoskeletal:        General: No tenderness.  Skin:    Comments: 62m laceration left thumb no gapping.    Neurological:     General: No focal deficit present.  Psychiatric:        Mood and Affect: Mood normal.      UC Treatments / Results  Labs (all labs ordered are listed, but only abnormal results are displayed) Labs Reviewed - No data to display  EKG   Radiology No results found.  Procedures Procedures (including critical care time)  Medications Ordered in UC Medications - No data to display  Initial Impression / Assessment and Plan / UC Course  I have reviewed the triage vital signs and  the nursing notes.  Pertinent labs & imaging results that were available during my care of the patient were reviewed by me and considered in my medical decision  making (see chart for details).     MDM:  Gelfoam applied to area and bandaged.   Final Clinical Impressions(s) / UC Diagnoses   Final diagnoses:  Laceration of left thumb without damage to nail, foreign body presence unspecified, initial encounter     Discharge Instructions     Leave bandage on for the next 48 hours.  The pad on the cut will felt prevent bleeding   ED Prescriptions    None     PDMP not reviewed this encounter.  An After Visit Summary was printed and given to the patient.    Fransico Meadow, [place] 10/27/19 1253

## 2019-11-20 DIAGNOSIS — G4733 Obstructive sleep apnea (adult) (pediatric): Secondary | ICD-10-CM | POA: Diagnosis not present

## 2019-11-21 DIAGNOSIS — G4733 Obstructive sleep apnea (adult) (pediatric): Secondary | ICD-10-CM | POA: Diagnosis not present

## 2019-11-22 ENCOUNTER — Other Ambulatory Visit: Payer: Self-pay

## 2019-11-22 ENCOUNTER — Encounter: Payer: Self-pay | Admitting: Family Medicine

## 2019-11-22 ENCOUNTER — Ambulatory Visit (INDEPENDENT_AMBULATORY_CARE_PROVIDER_SITE_OTHER): Payer: BC Managed Care – PPO | Admitting: Family Medicine

## 2019-11-22 VITALS — BP 110/70 | HR 87 | Temp 98.0°F | Ht 69.25 in | Wt 222.5 lb

## 2019-11-22 DIAGNOSIS — E118 Type 2 diabetes mellitus with unspecified complications: Secondary | ICD-10-CM

## 2019-11-22 LAB — LIPID PANEL
Cholesterol: 126 mg/dL (ref 0–200)
HDL: 38.2 mg/dL — ABNORMAL LOW (ref >39.00)
LDL Cholesterol: 62 mg/dL (ref 0–99)
NonHDL: 88.18
Total CHOL/HDL Ratio: 3
Triglycerides: 133 mg/dL (ref 0.0–149.0)
VLDL: 26.6 mg/dL (ref 0.0–40.0)

## 2019-11-22 LAB — COMPREHENSIVE METABOLIC PANEL
ALT: 43 U/L (ref 0–53)
AST: 39 U/L — ABNORMAL HIGH (ref 0–37)
Albumin: 4.6 g/dL (ref 3.5–5.2)
Alkaline Phosphatase: 77 U/L (ref 39–117)
BUN: 18 mg/dL (ref 6–23)
CO2: 33 mEq/L — ABNORMAL HIGH (ref 19–32)
Calcium: 9.5 mg/dL (ref 8.4–10.5)
Chloride: 103 mEq/L (ref 96–112)
Creatinine, Ser: 1.38 mg/dL (ref 0.40–1.50)
GFR: 55.21 mL/min — ABNORMAL LOW (ref >60.00)
Glucose, Bld: 126 mg/dL — ABNORMAL HIGH (ref 70–99)
Potassium: 3.9 mEq/L (ref 3.5–5.1)
Sodium: 142 mEq/L (ref 135–145)
Total Bilirubin: 1.9 mg/dL — ABNORMAL HIGH (ref 0.2–1.2)
Total Protein: 7.4 g/dL (ref 6.0–8.3)

## 2019-11-22 LAB — HEMOGLOBIN A1C: Hgb A1c MFr Bld: 6.5 % (ref 4.6–6.5)

## 2019-11-22 MED ORDER — CITALOPRAM HYDROBROMIDE 10 MG PO TABS: 10.0000 mg | ORAL_TABLET | Freq: Every day | ORAL | 3 refills | Status: DC

## 2019-11-22 NOTE — Patient Instructions (Addendum)
Labs today.  Reschedule physical for after 11/30/2019.

## 2019-11-22 NOTE — Progress Notes (Signed)
May be early for CPE - will reschedule. As fasting, will check labs today.   Today's Vitals   11/22/19 0830  BP: 110/70  Pulse: 87  Temp: 98 F (36.7 C)  TempSrc: Temporal  SpO2: 97%  Weight: 222 lb 8 oz (100.9 kg)  Height: 5' 9.25" (1.759 m)   Body mass index is 32.62 kg/m.   Wt Readings from Last 3 Encounters:  11/22/19 222 lb 8 oz (100.9 kg)  10/27/19 218 lb (98.9 kg)  09/06/19 220 lb (99.8 kg)

## 2019-12-13 ENCOUNTER — Ambulatory Visit (INDEPENDENT_AMBULATORY_CARE_PROVIDER_SITE_OTHER): Payer: BC Managed Care – PPO | Admitting: Family Medicine

## 2019-12-13 ENCOUNTER — Encounter: Payer: Self-pay | Admitting: Family Medicine

## 2019-12-13 ENCOUNTER — Other Ambulatory Visit: Payer: Self-pay

## 2019-12-13 ENCOUNTER — Encounter: Payer: Self-pay | Admitting: Internal Medicine

## 2019-12-13 VITALS — BP 112/66 | HR 97 | Temp 97.8°F | Ht 69.5 in | Wt 224.4 lb

## 2019-12-13 DIAGNOSIS — E669 Obesity, unspecified: Secondary | ICD-10-CM

## 2019-12-13 DIAGNOSIS — Z1211 Encounter for screening for malignant neoplasm of colon: Secondary | ICD-10-CM | POA: Diagnosis not present

## 2019-12-13 DIAGNOSIS — N529 Male erectile dysfunction, unspecified: Secondary | ICD-10-CM

## 2019-12-13 DIAGNOSIS — E785 Hyperlipidemia, unspecified: Secondary | ICD-10-CM

## 2019-12-13 DIAGNOSIS — S30860A Insect bite (nonvenomous) of lower back and pelvis, initial encounter: Secondary | ICD-10-CM

## 2019-12-13 DIAGNOSIS — I10 Essential (primary) hypertension: Secondary | ICD-10-CM

## 2019-12-13 DIAGNOSIS — G4733 Obstructive sleep apnea (adult) (pediatric): Secondary | ICD-10-CM

## 2019-12-13 DIAGNOSIS — E118 Type 2 diabetes mellitus with unspecified complications: Secondary | ICD-10-CM | POA: Diagnosis not present

## 2019-12-13 DIAGNOSIS — Z0001 Encounter for general adult medical examination with abnormal findings: Secondary | ICD-10-CM

## 2019-12-13 DIAGNOSIS — W57XXXA Bitten or stung by nonvenomous insect and other nonvenomous arthropods, initial encounter: Secondary | ICD-10-CM

## 2019-12-13 DIAGNOSIS — E1169 Type 2 diabetes mellitus with other specified complication: Secondary | ICD-10-CM

## 2019-12-13 DIAGNOSIS — K760 Fatty (change of) liver, not elsewhere classified: Secondary | ICD-10-CM

## 2019-12-13 DIAGNOSIS — Z0000 Encounter for general adult medical examination without abnormal findings: Secondary | ICD-10-CM

## 2019-12-13 MED ORDER — HYDROCHLOROTHIAZIDE 12.5 MG PO TABS: 12.5000 mg | ORAL_TABLET | Freq: Every day | ORAL | 3 refills | Status: DC

## 2019-12-13 NOTE — Assessment & Plan Note (Signed)
Chronic, stable on crestor 10mg  daily - continue. The ASCVD Risk score [name], et al., 2013) failed to calculate for the following reasons:   The valid total cholesterol range is 130 to 320 mg/dL   Unable to determine if patient is Non-Hispanic African American

## 2019-12-13 NOTE — Assessment & Plan Note (Signed)
Chronic, stable. Continue ozempic.

## 2019-12-13 NOTE — Assessment & Plan Note (Signed)
Continue to encourage healthy diet and lifestyle changes to affect sustainable weight loss.  

## 2019-12-13 NOTE — Assessment & Plan Note (Signed)
Continues CPAP with benefit.

## 2019-12-13 NOTE — Assessment & Plan Note (Signed)
Chronic. BP borderline low - with noted bump in Cr, will drop hctz to 12.5mg  daily. Monitor BP closely at home, let me know if staying too high or low. RTC 6 mo f/u visit.

## 2019-12-13 NOTE — Assessment & Plan Note (Signed)
Preventative protocols reviewed and updated unless pt declined. Discussed healthy diet and lifestyle.  

## 2019-12-13 NOTE — Assessment & Plan Note (Signed)
LFTs stable. Continue to monitor on crestor.

## 2019-12-13 NOTE — Assessment & Plan Note (Signed)
viagra effective - continue.  

## 2019-12-13 NOTE — Progress Notes (Signed)
This visit was conducted in person.  BP 112/66 (BP Location: Right Arm, Cuff Size: Large)   Pulse 97   Temp 97.8 F (36.6 C) (Temporal)   Ht 5' 9.5" (1.765 m)   Wt (!) 224 lb 7 oz (101.8 kg)   SpO2 95%   BMI 32.67 kg/m   BP Readings from Last 3 Encounters:  12/13/19 112/66  11/22/19 110/70  10/27/19 116/78    CC: CPE Subjective:    Patient ID: Michael Mcconnell, male    DOB: 09-Dec-1970, 49 y.o.   MRN: [id number]  HPI: Michael Mcconnell is a 49 y.o. male presenting on 12/13/2019 for Annual Exam   Recent trip to [place]. Tick bite 2 months ago - still not fully healed. Doesn't think it was present more than 24 hours. No tick borne illness symptoms.   Preventative: Colon cancer screening - discussed options - would like colonoscopy will refer Flu shot at work COVID vaccine - completed [place] series 08/2019  Pneumovax 2017  Td 2009, Tdap 10/2017  Seatbeltuse discussed  Sunscreen use discussed.No changing moles on skin. Ex smoker quit 2008, prior 2-3 cig/day  Alcohol - rare  Dentist Q6 mo Eye exam - yearly   Caffeine: 1 cups/day Lives with step daughter, wife, 2 sons, 2 dogs Occupation: Librarian, academic for [employer] Edu: studying business management through [employer] Activity:weights, treadmill and punching bag at home  Diet: good water, working on fruits/vegetables     Relevant past medical, surgical, family and social history reviewed and updated as indicated. Interim medical history since our last visit reviewed. Allergies and medications reviewed and updated. Outpatient Medications Prior to Visit  Medication Sig Dispense Refill  . Blood Glucose Monitoring Suppl (ONE TOUCH ULTRA MINI) w/Device KIT Use as directed 1 each 0  . citalopram (CELEXA) 10 MG tablet Take 1 tablet (10 mg total) by mouth daily. 90 tablet 3  . glucose blood (ONE TOUCH ULTRA TEST) test strip Once daily and as needed 100 each 3  . ibuprofen (ADVIL,MOTRIN) 600 MG tablet Take  1 tablet (600 mg total) by mouth every 8 (eight) hours as needed. 30 tablet 0  . losartan (COZAAR) 50 MG tablet Take 1 tablet (50 mg total) by mouth daily. 90 tablet 3  . Multiple Vitamins-Minerals (MULTIVITAMIN PO) Take 1 capsule by mouth daily.    Glory Rosebush Delica Lancets 36U MISC 1 EACH BY DOES NOT APPLY ROUTE AS DIRECTED. USE AS INSTRUCTED TO CHECK BLOOD SUGAR ONCE DAILY 100 each 11  . rosuvastatin (CRESTOR) 10 MG tablet Take 1 tablet (10 mg total) by mouth daily. 90 tablet 3  . Semaglutide,0.25 or 0.5MG/DOS, (OZEMPIC, 0.25 OR 0.5 MG/DOSE,) 2 MG/1.5ML SOPN Inject 0.5 mg into the skin once a week. 3 pen 3  . sildenafil (VIAGRA) 50 MG tablet Take 1 tablet (50 mg total) daily as needed by mouth for erectile dysfunction. 10 tablet 1  . hydrochlorothiazide (HYDRODIURIL) 25 MG tablet Take 1 tablet (25 mg total) by mouth daily. 90 tablet 3   No facility-administered medications prior to visit.     Per HPI unless specifically indicated in ROS section below Review of Systems  Constitutional: Negative for activity change, appetite change, chills, fatigue, fever and unexpected weight change.  HENT: Negative for hearing loss.   Eyes: Negative for visual disturbance.  Respiratory: Negative for cough, chest tightness, shortness of breath and wheezing.   Cardiovascular: Negative for chest pain, palpitations and leg swelling.  Gastrointestinal: Negative for abdominal distention,  abdominal pain, blood in stool, constipation, diarrhea, nausea and vomiting.  Genitourinary: Negative for difficulty urinating and hematuria.  Musculoskeletal: Negative for arthralgias, myalgias and neck pain.  Skin: Negative for rash.  Neurological: Negative for dizziness, seizures, syncope and headaches.  Hematological: Negative for adenopathy. Does not bruise/bleed easily.  Psychiatric/Behavioral: Negative for dysphoric mood. The patient is not nervous/anxious.    Objective:  BP 112/66 (BP Location: Right Arm, Cuff Size:  Large)   Pulse 97   Temp 97.8 F (36.6 C) (Temporal)   Ht 5' 9.5" (1.765 m)   Wt (!) 224 lb 7 oz (101.8 kg)   SpO2 95%   BMI 32.67 kg/m   Wt Readings from Last 3 Encounters:  12/13/19 (!) 224 lb 7 oz (101.8 kg)  11/22/19 222 lb 8 oz (100.9 kg)  10/27/19 218 lb (98.9 kg)      Physical Exam Vitals and nursing note reviewed.  Constitutional:      General: He is not in acute distress.    Appearance: Normal appearance. He is well-developed. He is not ill-appearing.  HENT:     Head: Normocephalic and atraumatic.     Right Ear: Hearing, tympanic membrane, ear canal and external ear normal.     Left Ear: Hearing, tympanic membrane, ear canal and external ear normal.  Eyes:     General: No scleral icterus.    Extraocular Movements: Extraocular movements intact.     Conjunctiva/sclera: Conjunctivae normal.     Pupils: Pupils are equal, round, and reactive to light.  Neck:     Thyroid: No thyroid mass, thyromegaly or thyroid tenderness.  Cardiovascular:     Rate and Rhythm: Normal rate and regular rhythm.     Pulses: Normal pulses.          Radial pulses are 2+ on the right side and 2+ on the left side.     Heart sounds: Normal heart sounds. No murmur heard.   Pulmonary:     Effort: Pulmonary effort is normal. No respiratory distress.     Breath sounds: Normal breath sounds. No wheezing, rhonchi or rales.  Abdominal:     General: Abdomen is flat. Bowel sounds are normal. There is no distension.     Palpations: Abdomen is soft. There is no mass.     Tenderness: There is no abdominal tenderness. There is no guarding or rebound.     Hernia: No hernia is present.  Musculoskeletal:        General: Normal range of motion.     Cervical back: Normal range of motion and neck supple.     Right lower leg: No edema.     Left lower leg: No edema.  Lymphadenopathy:     Cervical: No cervical adenopathy.  Skin:    General: Skin is warm and dry.     Findings: Lesion present. No rash.           Comments: Poorly healing tick bite midline mid back without erythema  Neurological:     General: No focal deficit present.     Mental Status: He is alert and oriented to person, place, and time.     Comments: CN grossly intact, station and gait intact  Psychiatric:        Mood and Affect: Mood normal.        Behavior: Behavior normal.        Thought Content: Thought content normal.        Judgment: Judgment normal.  Results for orders placed or performed in visit on 11/22/19  Lipid panel  Result Value Ref Range   Cholesterol 126 0 - 200 mg/dL   Triglycerides 133.0 0 - 149 mg/dL   HDL 38.20 (L) >39.00 mg/dL   VLDL 26.6 0.0 - 40.0 mg/dL   LDL Cholesterol 62 0 - 99 mg/dL   Total CHOL/HDL Ratio 3    NonHDL 88.18   Comprehensive metabolic panel  Result Value Ref Range   Sodium 142 135 - 145 mEq/L   Potassium 3.9 3.5 - 5.1 mEq/L   Chloride 103 96 - 112 mEq/L   CO2 33 (H) 19 - 32 mEq/L   Glucose, Bld 126 (H) 70 - 99 mg/dL   BUN 18 6 - 23 mg/dL   Creatinine, Ser 1.38 0.40 - 1.50 mg/dL   Total Bilirubin 1.9 (H) 0.2 - 1.2 mg/dL   Alkaline Phosphatase 77 39 - 117 U/L   AST 39 (H) 0 - 37 U/L   ALT 43 0 - 53 U/L   Total Protein 7.4 6.0 - 8.3 g/dL   Albumin 4.6 3.5 - 5.2 g/dL   GFR 55.21 (L) >60.00 mL/min   Calcium 9.5 8.4 - 10.5 mg/dL  Hemoglobin A1c  Result Value Ref Range   Hgb A1c MFr Bld 6.5 4.6 - 6.5 %   Assessment & Plan:  This visit occurred during the SARS-CoV-2 public health emergency.  Safety protocols were in place, including screening questions prior to the visit, additional usage of staff PPE, and extensive cleaning of exam room while observing appropriate contact time as indicated for disinfecting solutions.   Problem List Items Addressed This Visit    Tick bite of back    Poorly healing tick bite 6+ wks after initial bite - no obvious foreign body rec abx ointment and bandaid daily.  If not healing, consider excisional biopsy.       OSA (obstructive  sleep apnea)    Continues CPAP with benefit.      Obesity, Class I, BMI 30-34.9    Continue to encourage healthy diet and lifestyle changes to affect sustainable weight loss.       NAFLD (nonalcoholic fatty liver disease)    LFTs stable. Continue to monitor on crestor.       Hypertension    Chronic. BP borderline low - with noted bump in Cr, will drop hctz to 12.1m daily. Monitor BP closely at home, let me know if staying too high or low. RTC 6 mo f/u visit.       Relevant Medications   hydrochlorothiazide (HYDRODIURIL) 12.5 MG tablet   Health maintenance examination - Primary    Preventative protocols reviewed and updated unless pt declined. Discussed healthy diet and lifestyle.       Erectile dysfunction    viagra effective - continue.       Dyslipidemia associated with type 2 diabetes mellitus (HCC)    Chronic, stable on crestor 110mdaily - continue. The ASCVD Risk score (GMikey BussingC Jr., et al., 2013) failed to calculate for the following reasons:   The valid total cholesterol range is 130 to 320 mg/dL   Unable to determine if patient is Non-Hispanic African American       Controlled diabetes mellitus type 2 with complications (HCC)    Chronic, stable. Continue ozempic.        Other Visit Diagnoses    Special screening for malignant neoplasms, colon       Relevant Orders   Ambulatory referral  to Gastroenterology       Meds ordered this encounter  Medications  . hydrochlorothiazide (HYDRODIURIL) 12.5 MG tablet    Sig: Take 1 tablet (12.5 mg total) by mouth daily.    Dispense:  90 tablet    Refill:  3    Note new dose   Orders Placed This Encounter  Procedures  . Ambulatory referral to Gastroenterology    Referral Priority:   Routine    Referral Type:   Consultation    Referral Reason:   Specialty Services Required    Number of Visits Requested:   1    Patient instructions: We will refer you for colonoscopy. Double check with your insurance to ensure  screening colonoscopy is covered from age 75 and on.  Use antibiotic ointment to tick bite. If not improving, let me know to consider punch biopsy.  Drop hydrochlorothiazide to 12.71m daily - new dose at pharmacy. May cut current tablets in half until you run out. Keep an eye on blood pressures with above changes to ensure still running well controlled.  You are doing well today - continue current medicines otherwise.  Return as needed or in 6 months for follow up visit.   Follow up plan: Return in about 6 months (around 06/14/2020), or if symptoms worsen or fail to improve, for follow up visit.  JRia Bush MD

## 2019-12-13 NOTE — Patient Instructions (Addendum)
We will refer you for colonoscopy. Double check with your insurance to ensure screening colonoscopy is covered from age 47 and on.  Use antibiotic ointment to tick bite. If not improving, let me know to consider punch biopsy.  Drop hydrochlorothiazide to 12.5mg  daily - new dose at pharmacy. May cut current tablets in half until you run out. Keep an eye on blood pressures with above changes to ensure still running well controlled.  You are doing well today - continue current medicines otherwise.  Return as needed or in 6 months for follow up visit.   Health Maintenance, Male Adopting a healthy lifestyle and getting preventive care are important in promoting health and wellness. Ask your health care provider about:  The right schedule for you to have regular tests and exams.  Things you can do on your own to prevent diseases and keep yourself healthy. What should I know about diet, weight, and exercise? Eat a healthy diet   Eat a diet that includes plenty of vegetables, fruits, low-fat dairy products, and lean protein.  Do not eat a lot of foods that are high in solid fats, added sugars, or sodium. Maintain a healthy weight Body mass index (BMI) is a measurement that can be used to identify possible weight problems. It estimates body fat based on height and weight. Your health care provider can help determine your BMI and help you achieve or maintain a healthy weight. Get regular exercise Get regular exercise. This is one of the most important things you can do for your health. Most adults should:  Exercise for at least 150 minutes each week. The exercise should increase your heart rate and make you sweat (moderate-intensity exercise).  Do strengthening exercises at least twice a week. This is in addition to the moderate-intensity exercise.  Spend less time sitting. Even light physical activity can be beneficial. Watch cholesterol and blood lipids Have your blood tested for lipids and  cholesterol at 47 years of age, then have this test every 5 years. You may need to have your cholesterol levels checked more often if:  Your lipid or cholesterol levels are high.  You are older than 47 years of age.  You are at high risk for heart disease. What should I know about cancer screening? Many types of cancers can be detected early and may often be prevented. Depending on your health history and family history, you may need to have cancer screening at various ages. This may include screening for:  Colorectal cancer.  Prostate cancer.  Skin cancer.  Lung cancer. What should I know about heart disease, diabetes, and high blood pressure? Blood pressure and heart disease  High blood pressure causes heart disease and increases the risk of stroke. This is more likely to develop in people who have high blood pressure readings, are of African descent, or are overweight.  Talk with your health care provider about your target blood pressure readings.  Have your blood pressure checked: ? Every 3-5 years if you are 61-46 years of age. ? Every year if you are 36 years old or older.  If you are between the ages of 86 and 24 and are a current or former smoker, ask your health care provider if you should have a one-time screening for abdominal aortic aneurysm (AAA). Diabetes Have regular diabetes screenings. This checks your fasting blood sugar level. Have the screening done:  Once every three years after age 110 if you are at a normal weight and have a  low risk for diabetes.  More often and at a younger age if you are overweight or have a high risk for diabetes. What should I know about preventing infection? Hepatitis B If you have a higher risk for hepatitis B, you should be screened for this virus. Talk with your health care provider to find out if you are at risk for hepatitis B infection. Hepatitis C Blood testing is recommended for:  Everyone born from 1945 through  1965.  Anyone with known risk factors for hepatitis C. Sexually transmitted infections (STIs)  You should be screened each year for STIs, including gonorrhea and chlamydia, if: ? You are sexually active and are younger than 47 years of age. ? You are older than 47 years of age and your health care provider tells you that you are at risk for this type of infection. ? Your sexual activity has changed since you were last screened, and you are at increased risk for chlamydia or gonorrhea. Ask your health care provider if you are at risk.  Ask your health care provider about whether you are at high risk for HIV. Your health care provider may recommend a prescription medicine to help prevent HIV infection. If you choose to take medicine to prevent HIV, you should first get tested for HIV. You should then be tested every 3 months for as long as you are taking the medicine. Follow these instructions at home: Lifestyle  Do not use any products that contain nicotine or tobacco, such as cigarettes, e-cigarettes, and chewing tobacco. If you need help quitting, ask your health care provider.  Do not use street drugs.  Do not share needles.  Ask your health care provider for help if you need support or information about quitting drugs. Alcohol use  Do not drink alcohol if your health care provider tells you not to drink.  If you drink alcohol: ? Limit how much you have to 0-2 drinks a day. ? Be aware of how much alcohol is in your drink. In the [place] one drink equals one 12 oz bottle of beer (355 mL), one 5 oz glass of wine (148 mL), or one 1 oz glass of hard liquor (44 mL). General instructions  Schedule regular health, dental, and eye exams.  Stay current with your vaccines.  Tell your health care provider if: ? You often feel depressed. ? You have ever been abused or do not feel safe at home. Summary  Adopting a healthy lifestyle and getting preventive care are important in promoting  health and wellness.  Follow your health care provider's instructions about healthy diet, exercising, and getting tested or screened for diseases.  Follow your health care provider's instructions on monitoring your cholesterol and blood pressure. This information is not intended to replace advice given to you by your health care provider. Make sure you discuss any questions you have with your health care provider. Document Revised: 05/02/2018 Document Reviewed: 05/02/2018 Elsevier Patient Education  2020 [employer]vier Inc.  

## 2019-12-13 NOTE — Assessment & Plan Note (Addendum)
Poorly healing tick bite 6+ wks after initial bite - no obvious foreign body rec abx ointment and bandaid daily.  If not healing, consider excisional biopsy.

## 2020-01-20 DIAGNOSIS — G4733 Obstructive sleep apnea (adult) (pediatric): Secondary | ICD-10-CM | POA: Diagnosis not present

## 2020-02-14 ENCOUNTER — Other Ambulatory Visit: Payer: Self-pay

## 2020-02-14 ENCOUNTER — Ambulatory Visit (AMBULATORY_SURGERY_CENTER): Payer: Self-pay | Admitting: *Deleted

## 2020-02-14 ENCOUNTER — Encounter: Payer: Self-pay | Admitting: Internal Medicine

## 2020-02-14 VITALS — Ht 69.5 in | Wt 224.0 lb

## 2020-02-14 DIAGNOSIS — Z1211 Encounter for screening for malignant neoplasm of colon: Secondary | ICD-10-CM

## 2020-02-14 MED ORDER — SUTAB 1479-225-188 MG PO TABS: 1.0000 | ORAL_TABLET | ORAL | 0 refills | Status: DC

## 2020-02-14 NOTE — Progress Notes (Signed)
Patient is here in-person for PV. Patient denies any allergies to eggs or soy. Patient denies any problems with anesthesia/sedation. Patient denies any oxygen use at home. Patient denies taking any diet/weight loss medications or blood thinners. Patient is not being treated for MRSA or C-diff. Patient is aware of our care-partner policy and [facility] safety protocol. EMMI education assisgned to the patient for the procedure, sent to MyChart.   COVID-19 vaccines completed on 08/26/19, per patient.   Prep Prescription coupon given to the patient.

## 2020-02-20 DIAGNOSIS — G4733 Obstructive sleep apnea (adult) (pediatric): Secondary | ICD-10-CM | POA: Diagnosis not present

## 2020-02-21 ENCOUNTER — Other Ambulatory Visit: Payer: Self-pay

## 2020-02-21 ENCOUNTER — Ambulatory Visit (AMBULATORY_SURGERY_CENTER): Payer: BC Managed Care – PPO | Admitting: Internal Medicine

## 2020-02-21 ENCOUNTER — Encounter: Payer: Self-pay | Admitting: Internal Medicine

## 2020-02-21 VITALS — BP 122/84 | HR 68 | Temp 97.3°F | Resp 17 | Ht 69.5 in | Wt 224.0 lb

## 2020-02-21 DIAGNOSIS — D123 Benign neoplasm of transverse colon: Secondary | ICD-10-CM | POA: Diagnosis not present

## 2020-02-21 DIAGNOSIS — Z1211 Encounter for screening for malignant neoplasm of colon: Secondary | ICD-10-CM

## 2020-02-21 DIAGNOSIS — D122 Benign neoplasm of ascending colon: Secondary | ICD-10-CM | POA: Diagnosis not present

## 2020-02-21 HISTORY — PX: COLONOSCOPY: SHX174

## 2020-02-21 MED ORDER — SODIUM CHLORIDE 0.9 % IV SOLN
500.0000 mL | Freq: Once | INTRAVENOUS | Status: DC
Start: 2020-02-21 — End: 2021-01-22

## 2020-02-21 NOTE — Progress Notes (Signed)
Pt. Reports no change in his medical or surgical history since his pre-visit 02/14/20.

## 2020-02-21 NOTE — Progress Notes (Signed)
Report to PACU, RN, vss, BBS= Clear.  

## 2020-02-21 NOTE — Op Note (Signed)
[facility] Patient Name: Michael Mcconnell Procedure Date: 02/21/2020 10:40 AM MRN: [id number] Endoscopist: Docia Chuck. Henrene Pastor , MD Age: 49 Referring MD:  Date of Birth: 09-13-1970 Gender: Male Account #: [id number] Procedure:                Colonoscopy with cold snare polypectomy x 4 Indications:              Screening for colorectal malignant neoplasm Medicines:                Monitored Anesthesia Care Procedure:                Pre-Anesthesia Assessment:                           - Prior to the procedure, a History and Physical                            was performed, and patient medications and                            allergies were reviewed. The patient's tolerance of                            previous anesthesia was also reviewed. The risks                            and benefits of the procedure and the sedation                            options and risks were discussed with the patient.                            All questions were answered, and informed consent                            was obtained. Prior Anticoagulants: The patient has                            taken no previous anticoagulant or antiplatelet                            agents. ASA Grade Assessment: II - A patient with                            mild systemic disease. After reviewing the risks                            and benefits, the patient was deemed in                            satisfactory condition to undergo the procedure.                           After obtaining informed consent, the colonoscope  was passed under direct vision. Throughout the                            procedure, the patient's blood pressure, pulse, and                            oxygen saturations were monitored continuously. The                            Colonoscope was introduced through the anus and                            advanced to the the cecum, identified by                             appendiceal orifice and ileocecal valve. The                            ileocecal valve, appendiceal orifice, and rectum                            were photographed. The quality of the bowel                            preparation was excellent. The colonoscopy was                            performed without difficulty. The patient tolerated                            the procedure well. The bowel preparation used was                            SUPREP via split dose instruction. Scope In: 10:45:46 AM Scope Out: 10:58:13 AM Scope Withdrawal Time: 0 hours 10 minutes 40 seconds  Total Procedure Duration: 0 hours 12 minutes 27 seconds  Findings:                 Four polyps were found in the transverse colon and                            ascending colon. The polyps were 3 to 5 mm in size.                            These polyps were removed with a cold snare.                            Resection and retrieval were complete.                           Internal hemorrhoids were found during                            retroflexion. The hemorrhoids were small.  The exam was otherwise without abnormality on                            direct and retroflexion views. Complications:            No immediate complications. Estimated blood loss:                            None. Estimated Blood Loss:     Estimated blood loss: none. Impression:               - Four 3 to 5 mm polyps in the transverse colon and                            in the ascending colon, removed with a cold snare.                            Resected and retrieved.                           - Internal hemorrhoids.                           - The examination was otherwise normal on direct                            and retroflexion views. Recommendation:           - Repeat colonoscopy in 3 or 5 years for                            surveillance, pending final pathology.                           - Patient has  a contact number available for                            emergencies. The signs and symptoms of potential                            delayed complications were discussed with the                            patient. Return to normal activities tomorrow.                            Written discharge instructions were provided to the                            patient.                           - Resume previous diet.                           - Continue present medications.                           -  Await pathology results.                           - Recommend taking Metamucil 2 tablespoons daily in                            12 to 14 ounces of water or juice. This may improve                            bowel consistency and hemorrhoids. Docia Chuck. Henrene Pastor, MD 02/21/2020 11:05:13 AM This report has been signed electronically.

## 2020-02-21 NOTE — Patient Instructions (Signed)
Read all of the handouts given to you by your recovery room nurse.   Thank-you for choosing us for your healthcare needs today.  YOU HAD AN ENDOSCOPIC PROCEDURE TODAY AT THE [facility]:   Refer to the procedure report that was given to you for any specific questions about what was found during the examination.  If the procedure report does not answer your questions, please call your gastroenterologist to clarify.  If you requested that your care partner not be given the details of your procedure findings, then the procedure report has been included in a sealed envelope for you to review at your convenience later.  YOU SHOULD EXPECT: Some feelings of bloating in the abdomen. Passage of more gas than usual.  Walking can help get rid of the air that was put into your GI tract during the procedure and reduce the bloating. If you had a lower endoscopy (such as a colonoscopy or flexible sigmoidoscopy) you may notice spotting of blood in your stool or on the toilet paper. If you underwent a bowel prep for your procedure, you may not have a normal bowel movement for a few days.  Please Note:  You might notice some irritation and congestion in your nose or some drainage.  This is from the oxygen used during your procedure.  There is no need for concern and it should clear up in a day or so.  SYMPTOMS TO REPORT IMMEDIATELY:   Following lower endoscopy (colonoscopy or flexible sigmoidoscopy):  Excessive amounts of blood in the stool  Significant tenderness or worsening of abdominal pains  Swelling of the abdomen that is new, acute  Fever of 100F or higher   For urgent or emergent issues, a gastroenterologist can be reached at any hour by calling [phone]. Do not use MyChart messaging for urgent concerns.    DIET:  We do recommend a small meal at first, but then you may proceed to your regular diet.  Drink plenty of fluids but you should avoid alcoholic beverages for 24  hours.  ACTIVITY:  You should plan to take it easy for the rest of today and you should NOT DRIVE or use heavy machinery until tomorrow (because of the sedation medicines used during the test).    FOLLOW UP: Our staff will call the number listed on your records 48-72 hours following your procedure to check on you and address any questions or concerns that you may have regarding the information given to you following your procedure. If we do not reach you, we will leave a message.  We will attempt to reach you two times.  During this call, we will ask if you have developed any symptoms of COVID 19. If you develop any symptoms (ie: fever, flu-like symptoms, shortness of breath, cough etc.) before then, please call [phone].  If you test positive for Covid 19 in the 2 weeks post procedure, please call and report this information to us.    If any biopsies were taken you will be contacted by phone or by letter within the next 1-3 weeks.  Please call us at [phone] if you have not heard about the biopsies in 3 weeks.    SIGNATURES/CONFIDENTIALITY: You and/or your care partner have signed paperwork which will be entered into your electronic medical record.  These signatures attest to the fact that that the information above on your After Visit Summary has been reviewed and is understood.  Full responsibility of the confidentiality of this discharge   lies with you and/or your care-partner.

## 2020-02-21 NOTE — Progress Notes (Signed)
Called to room to assist during endoscopic procedure.  Patient ID and intended procedure confirmed with present staff. Received instructions for my participation in the procedure from the performing physician.  

## 2020-02-25 ENCOUNTER — Telehealth: Payer: Self-pay | Admitting: *Deleted

## 2020-02-25 NOTE — Telephone Encounter (Signed)
  Follow up Call-  Call back number 02/21/2020  Post procedure Call Back phone  # [phone]  Permission to leave phone message Yes  Some recent data might be hidden     Patient questions:  Do you have a fever, pain , or abdominal swelling? No. Pain Score  0 *  Have you tolerated food without any problems? Yes.    Have you been able to return to your normal activities? Yes.    Do you have any questions about your discharge instructions: Diet   No. Medications  No. Follow up visit  No.  Do you have questions or concerns about your Care? No.  Actions: * If pain score is 4 or above: No action needed, pain <4  1. Have you developed a fever since your procedure? NO  2.   Have you had an respiratory symptoms (SOB or cough) since your procedure? NO  3.   Have you tested positive for COVID 19 since your procedure NO  4.   Have you had any family members/close contacts diagnosed with the COVID 19 since your procedure?  NO   If yes to any of these questions please route to Joylene John, RN and Joella Prince, RN \

## 2020-02-27 ENCOUNTER — Encounter: Payer: Self-pay | Admitting: Internal Medicine

## 2020-03-02 ENCOUNTER — Encounter: Payer: Self-pay | Admitting: Family Medicine

## 2020-03-05 DIAGNOSIS — R059 Cough, unspecified: Secondary | ICD-10-CM | POA: Diagnosis not present

## 2020-03-05 DIAGNOSIS — R0981 Nasal congestion: Secondary | ICD-10-CM | POA: Diagnosis not present

## 2020-03-05 DIAGNOSIS — Z20822 Contact with and (suspected) exposure to covid-19: Secondary | ICD-10-CM | POA: Diagnosis not present

## 2020-03-10 ENCOUNTER — Encounter: Payer: Self-pay | Admitting: Family Medicine

## 2020-03-10 MED ORDER — HYDROCHLOROTHIAZIDE 12.5 MG PO TABS
12.5000 mg | ORAL_TABLET | Freq: Every day | ORAL | 2 refills | Status: DC
Start: 2020-03-10 — End: 2020-06-19

## 2020-03-10 NOTE — Telephone Encounter (Signed)
E-scribed refill 

## 2020-03-21 DIAGNOSIS — G4733 Obstructive sleep apnea (adult) (pediatric): Secondary | ICD-10-CM | POA: Diagnosis not present

## 2020-04-10 ENCOUNTER — Other Ambulatory Visit: Payer: Self-pay | Admitting: Family Medicine

## 2020-04-13 ENCOUNTER — Encounter: Payer: Self-pay | Admitting: Family Medicine

## 2020-04-13 MED ORDER — CITALOPRAM HYDROBROMIDE 10 MG PO TABS
10.0000 mg | ORAL_TABLET | Freq: Every day | ORAL | 2 refills | Status: DC
Start: 2020-04-13 — End: 2021-01-07

## 2020-04-13 NOTE — Telephone Encounter (Signed)
E-scribed refill 

## 2020-04-21 DIAGNOSIS — G4733 Obstructive sleep apnea (adult) (pediatric): Secondary | ICD-10-CM | POA: Diagnosis not present

## 2020-06-19 ENCOUNTER — Encounter: Payer: Self-pay | Admitting: Family Medicine

## 2020-06-19 ENCOUNTER — Ambulatory Visit: Payer: BC Managed Care – PPO | Admitting: Family Medicine

## 2020-06-19 ENCOUNTER — Other Ambulatory Visit: Payer: Self-pay

## 2020-06-19 VITALS — BP 118/76 | HR 72 | Temp 97.8°F | Ht 69.5 in | Wt 225.2 lb

## 2020-06-19 DIAGNOSIS — E118 Type 2 diabetes mellitus with unspecified complications: Secondary | ICD-10-CM | POA: Diagnosis not present

## 2020-06-19 DIAGNOSIS — Q809 Congenital ichthyosis, unspecified: Secondary | ICD-10-CM | POA: Diagnosis not present

## 2020-06-19 DIAGNOSIS — I10 Essential (primary) hypertension: Secondary | ICD-10-CM | POA: Diagnosis not present

## 2020-06-19 LAB — POCT GLYCOSYLATED HEMOGLOBIN (HGB A1C): Hemoglobin A1C: 6.5 % — AB (ref 4.0–5.6)

## 2020-06-19 LAB — RENAL FUNCTION PANEL
Albumin: 4.6 g/dL (ref 3.5–5.2)
BUN: 16 mg/dL (ref 6–23)
CO2: 32 mEq/L (ref 19–32)
Calcium: 9.4 mg/dL (ref 8.4–10.5)
Chloride: 104 mEq/L (ref 96–112)
Creatinine, Ser: 1.4 mg/dL (ref 0.40–1.50)
GFR: 59.8 mL/min — ABNORMAL LOW (ref >60.00)
Glucose, Bld: 111 mg/dL — ABNORMAL HIGH (ref 70–99)
Phosphorus: 4 mg/dL (ref 2.3–4.6)
Potassium: 4 mEq/L (ref 3.5–5.1)
Sodium: 141 mEq/L (ref 135–145)

## 2020-06-19 MED ORDER — ROSUVASTATIN CALCIUM 10 MG PO TABS
10.0000 mg | ORAL_TABLET | Freq: Every day | ORAL | 3 refills | Status: DC
Start: 2020-06-19 — End: 2021-01-22

## 2020-06-19 MED ORDER — LOSARTAN POTASSIUM-HCTZ 50-12.5 MG PO TABS: 1.0000 | ORAL_TABLET | Freq: Every day | ORAL | 2 refills | Status: DC

## 2020-06-19 NOTE — Progress Notes (Signed)
Patient ID: JARL SELLITTO, male    DOB: Oct 12, 1970, 50 y.o.   MRN: [id number]  This visit was conducted in person.  BP 118/76 (BP Location: Left Arm, Patient Position: Sitting, Cuff Size: Large)   Pulse 72   Temp 97.8 F (36.6 C) (Temporal)   Ht 5' 9.5" (1.765 m)   Wt 225 lb 3 oz (102.1 kg)   SpO2 96%   BMI 32.78 kg/m    CC: 33mof/u visit  Subjective:   HPI: JHARBOR VANOVERis a 50y.o. male presenting on 06/19/2020 for Follow-up (Here for 6 mo f/u.)   Promotion at work - mRestaurant manager, fast foodposition.   DM - does regularly check sugars 115-126. Compliant with antihyperglycemic regimen which includes: ozempic 0.522mweekly. Denies low sugars or hypoglycemic symptoms. Denies paresthesias. Last diabetic eye exam 06/2019. Pneumovax: 12/2015. Prevnar: not due. Glucometer brand: onetouch. [facility] 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 Interim medical history since our last visit reviewed. Allergies and medications reviewed and updated. Outpatient Medications Prior to Visit  Medication Sig Dispense Refill  . Blood Glucose Monitoring Suppl (ONE TOUCH ULTRA MINI) w/Device KIT Use as directed 1 each 0  . citalopram (CELEXA) 10 MG tablet Take 1 tablet (10 mg total) by mouth daily. 90 tablet 2  . glucose blood (ONE TOUCH ULTRA TEST) test strip Once daily and as needed 100 each 3  .  Multiple Vitamins-Minerals (MULTIVITAMIN PO) Take 1 capsule by mouth daily.    . Glory Rosebushelica Lancets 3023FISC 1 EACH BY DOES NOT APPLY ROUTE AS DIRECTED. USE AS INSTRUCTED TO CHECK BLOOD SUGAR ONCE DAILY 100 each 11  . Semaglutide,0.25 or 0.5MG/DOS, (OZEMPIC, 0.25 OR 0.5 MG/DOSE,) 2 MG/1.5ML SOPN Inject 0.5 mg into the skin once a week. 3 pen 3  . sildenafil (VIAGRA) 50 MG tablet Take 1 tablet (50 mg total) daily as needed by mouth for erectile dysfunction. 10 tablet 1  . hydrochlorothiazide (HYDRODIURIL) 12.5 MG tablet Take 1 tablet (12.5 mg total) by mouth daily. 90 tablet 2  . losartan (COZAAR) 50 MG tablet Take 1 tablet (50 mg total) by mouth daily. 90 tablet 3  . rosuvastatin (CRESTOR) 10 MG tablet Take 1 tablet (10 mg total) by mouth daily. 90 tablet 3   Facility-Administered Medications Prior to Visit  Medication Dose Route Frequency Provider Last Rate Last Admin  . 0.9 %  sodium chloride infusion  500 mL Intravenous Once PeIrene ShipperMD         Per HPI unless specifically indicated in ROS section below Review of Systems Objective:  BP 118/76 (BP Location: Left Arm, Patient Position: Sitting, Cuff Size: Large)   Pulse 72   Temp 97.8 F (36.6 C) (Temporal)   Ht 5' 9.5" (1.765 m)   Wt 225 lb 3 oz (102.1 kg)   SpO2 96%   BMI 32.78 kg/m  Wt Readings from Last 3 Encounters:  06/19/20 225 lb 3 oz (102.1 kg)  02/21/20 224 lb (101.6 kg)  02/14/20 224 lb (101.6 kg)      Physical Exam Vitals and nursing note reviewed.  Constitutional:      General: He is not in acute distress.    Appearance: Normal appearance. He is well-developed and well-nourished. He is not ill-appearing.  HENT:     Head: Normocephalic and atraumatic.     Mouth/Throat:     Mouth: Oropharynx is clear and moist.  Eyes:     General: No scleral icterus.    Extraocular Movements: Extraocular movements intact and EOM normal.     Conjunctiva/sclera: Conjunctivae normal.     Pupils: Pupils are equal, round,  and reactive to light.  Cardiovascular:     Rate and Rhythm: Normal rate and regular rhythm.     Pulses: Normal pulses and intact distal pulses.     Heart sounds: Normal heart sounds. No murmur heard.   Pulmonary:     Effort: Pulmonary effort is normal. No respiratory distress.     Breath sounds: Normal breath sounds. No wheezing, rhonchi or rales.  Musculoskeletal:        General: No edema.     Cervical back: Normal range of motion and neck supple.     Comments: See HPI for foot exam if done  Lymphadenopathy:     Cervical: No cervical adenopathy.  Skin:    General: Skin is warm and dry.     Findings: Rash present.     Comments: Ichthyosis BLE  Neurological:     Mental Status: He is alert.  Psychiatric:        Mood and Affect: Mood and affect and mood normal.        Behavior: Behavior normal.       Results for orders placed or performed in visit on 06/19/20  POCT glycosylated hemoglobin (Hb A1C)  Result Value Ref Range   Hemoglobin A1C 6.5 (A) 4.0 - 5.6 %   HbA1c POC (<> result, manual entry)     HbA1c, POC (prediabetic range)     HbA1c, POC (controlled diabetic range)     Assessment & Plan:  This visit occurred during the SARS-CoV-2 public health emergency.  Safety protocols were in place, including screening questions prior to the visit, additional usage of staff PPE, and extensive cleaning of exam room while observing appropriate contact time as indicated for disinfecting solutions.   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 Encouraged regular moisturizing. He uses amlactin intermittently.       Hypertension    Chronic, stable. Update renal panel. Change BP meds to combo regimen.       Relevant Medications   losartan-hydrochlorothiazide (HYZAAR) 50-12.5 MG tablet   rosuvastatin (CRESTOR) 10 MG tablet   Controlled diabetes mellitus type 2 with complications (HCC) - Primary    Chronic, stable on ozempic.  Continue this. Foot exam today.       Relevant Medications   losartan-hydrochlorothiazide (HYZAAR) 50-12.5 MG tablet   rosuvastatin (CRESTOR) 10 MG tablet   Other Relevant Orders   POCT glycosylated hemoglobin (Hb A1C) (Completed)   Renal function panel       Meds ordered this encounter  Medications  . losartan-hydrochlorothiazide (HYZAAR) 50-12.5 MG tablet    Sig: Take 1 tablet by mouth daily.    Dispense:  90 tablet    Refill:  2    To replace other BP meds  . rosuvastatin (CRESTOR) 10 MG tablet    Sig: Take 1 tablet (10 mg total) by mouth daily.    Dispense:  90 tablet    Refill:  3   Orders Placed This Encounter  Procedures  . Renal function panel  . POCT glycosylated hemoglobin (Hb A1C)    Patient Instructions  You are doing well today Kidney function checked today.  Return as needed or in 6 months for physical.  Sugars are doing great! Continue ozempic.  Start combo pill for blood pressure instead of individual components - combo pill sent to pharmacy.   Follow up plan: Return in about 6 months (around 12/17/2020) for annual exam, prior fasting for blood work.  Ria Bush, MD

## 2020-06-19 NOTE — Assessment & Plan Note (Signed)
Chronic, stable. Update renal panel. Change BP meds to combo regimen.

## 2020-06-19 NOTE — Patient Instructions (Addendum)
You are doing well today Kidney function checked today.  Return as needed or in 6 months for physical.  Sugars are doing great! Continue ozempic.  Start combo pill for blood pressure instead of individual components - combo pill sent to pharmacy.

## 2020-06-19 NOTE — Assessment & Plan Note (Signed)
To BLE and feet (interdigital area). Encouraged regular moisturizing. He uses amlactin intermittently.

## 2020-06-19 NOTE — Assessment & Plan Note (Signed)
Chronic, stable on ozempic. Continue this. Foot exam today.

## 2020-07-29 ENCOUNTER — Other Ambulatory Visit: Payer: Self-pay | Admitting: Family Medicine

## 2020-08-14 DIAGNOSIS — G4733 Obstructive sleep apnea (adult) (pediatric): Secondary | ICD-10-CM | POA: Diagnosis not present

## 2020-10-20 ENCOUNTER — Other Ambulatory Visit: Payer: Self-pay | Admitting: Family Medicine

## 2020-11-05 ENCOUNTER — Other Ambulatory Visit: Payer: Self-pay | Admitting: Family Medicine

## 2020-11-17 DIAGNOSIS — G4733 Obstructive sleep apnea (adult) (pediatric): Secondary | ICD-10-CM | POA: Diagnosis not present

## 2020-12-09 ENCOUNTER — Other Ambulatory Visit: Payer: Self-pay | Admitting: Family Medicine

## 2020-12-09 DIAGNOSIS — E118 Type 2 diabetes mellitus with unspecified complications: Secondary | ICD-10-CM

## 2020-12-09 DIAGNOSIS — Z1159 Encounter for screening for other viral diseases: Secondary | ICD-10-CM

## 2020-12-09 DIAGNOSIS — E785 Hyperlipidemia, unspecified: Secondary | ICD-10-CM

## 2020-12-09 DIAGNOSIS — E1169 Type 2 diabetes mellitus with other specified complication: Secondary | ICD-10-CM

## 2020-12-11 ENCOUNTER — Other Ambulatory Visit: Payer: Self-pay

## 2020-12-11 ENCOUNTER — Other Ambulatory Visit (INDEPENDENT_AMBULATORY_CARE_PROVIDER_SITE_OTHER): Payer: BC Managed Care – PPO

## 2020-12-11 DIAGNOSIS — E118 Type 2 diabetes mellitus with unspecified complications: Secondary | ICD-10-CM | POA: Diagnosis not present

## 2020-12-11 DIAGNOSIS — Z1159 Encounter for screening for other viral diseases: Secondary | ICD-10-CM | POA: Diagnosis not present

## 2020-12-11 DIAGNOSIS — E1169 Type 2 diabetes mellitus with other specified complication: Secondary | ICD-10-CM | POA: Diagnosis not present

## 2020-12-11 DIAGNOSIS — E785 Hyperlipidemia, unspecified: Secondary | ICD-10-CM | POA: Diagnosis not present

## 2020-12-11 LAB — COMPREHENSIVE METABOLIC PANEL
ALT: 39 U/L (ref 0–53)
AST: 34 U/L (ref 0–37)
Albumin: 4.3 g/dL (ref 3.5–5.2)
Alkaline Phosphatase: 73 U/L (ref 39–117)
BUN: 15 mg/dL (ref 6–23)
CO2: 30 mEq/L (ref 19–32)
Calcium: 9 mg/dL (ref 8.4–10.5)
Chloride: 105 mEq/L (ref 96–112)
Creatinine, Ser: 1.31 mg/dL (ref 0.40–1.50)
GFR: 64.54 mL/min (ref >60.00)
Glucose, Bld: 129 mg/dL — ABNORMAL HIGH (ref 70–99)
Potassium: 3.9 mEq/L (ref 3.5–5.1)
Sodium: 142 mEq/L (ref 135–145)
Total Bilirubin: 1.5 mg/dL — ABNORMAL HIGH (ref 0.2–1.2)
Total Protein: 6.9 g/dL (ref 6.0–8.3)

## 2020-12-11 LAB — LIPID PANEL
Cholesterol: 113 mg/dL (ref 0–200)
HDL: 32.5 mg/dL — ABNORMAL LOW (ref >39.00)
LDL Cholesterol: 55 mg/dL (ref 0–99)
NonHDL: 80.21
Total CHOL/HDL Ratio: 3
Triglycerides: 126 mg/dL (ref 0.0–149.0)
VLDL: 25.2 mg/dL (ref 0.0–40.0)

## 2020-12-11 LAB — HEMOGLOBIN A1C: Hgb A1c MFr Bld: 7.2 % — ABNORMAL HIGH (ref 4.6–6.5)

## 2020-12-14 LAB — HEPATITIS C ANTIBODY
Hepatitis C Ab: NONREACTIVE
SIGNAL TO CUT-OFF: 0.01 (ref <1.00)

## 2020-12-18 ENCOUNTER — Encounter: Payer: BC Managed Care – PPO | Admitting: Family Medicine

## 2021-01-07 ENCOUNTER — Other Ambulatory Visit: Payer: Self-pay | Admitting: Family Medicine

## 2021-01-22 ENCOUNTER — Other Ambulatory Visit: Payer: Self-pay

## 2021-01-22 ENCOUNTER — Ambulatory Visit (INDEPENDENT_AMBULATORY_CARE_PROVIDER_SITE_OTHER): Payer: BC Managed Care – PPO | Admitting: Family Medicine

## 2021-01-22 ENCOUNTER — Encounter: Payer: Self-pay | Admitting: Family Medicine

## 2021-01-22 VITALS — BP 118/72 | HR 89 | Temp 97.7°F | Ht 69.0 in | Wt 228.4 lb

## 2021-01-22 DIAGNOSIS — E118 Type 2 diabetes mellitus with unspecified complications: Secondary | ICD-10-CM | POA: Diagnosis not present

## 2021-01-22 DIAGNOSIS — E1136 Type 2 diabetes mellitus with diabetic cataract: Secondary | ICD-10-CM

## 2021-01-22 DIAGNOSIS — Z0000 Encounter for general adult medical examination without abnormal findings: Secondary | ICD-10-CM

## 2021-01-22 DIAGNOSIS — E785 Hyperlipidemia, unspecified: Secondary | ICD-10-CM

## 2021-01-22 DIAGNOSIS — G4733 Obstructive sleep apnea (adult) (pediatric): Secondary | ICD-10-CM

## 2021-01-22 DIAGNOSIS — E1169 Type 2 diabetes mellitus with other specified complication: Secondary | ICD-10-CM

## 2021-01-22 DIAGNOSIS — I10 Essential (primary) hypertension: Secondary | ICD-10-CM

## 2021-01-22 DIAGNOSIS — K760 Fatty (change of) liver, not elsewhere classified: Secondary | ICD-10-CM

## 2021-01-22 DIAGNOSIS — E669 Obesity, unspecified: Secondary | ICD-10-CM

## 2021-01-22 MED ORDER — LOSARTAN POTASSIUM-HCTZ 50-12.5 MG PO TABS: 1.0000 | ORAL_TABLET | Freq: Every day | ORAL | 3 refills | Status: DC

## 2021-01-22 MED ORDER — SEMAGLUTIDE (1 MG/DOSE) 4 MG/3ML ~~LOC~~ SOPN: 1.0000 mg | PEN_INJECTOR | SUBCUTANEOUS | 3 refills | Status: DC

## 2021-01-22 MED ORDER — ROSUVASTATIN CALCIUM 10 MG PO TABS: 10.0000 mg | ORAL_TABLET | Freq: Every day | ORAL | 3 refills | Status: DC

## 2021-01-22 NOTE — Assessment & Plan Note (Signed)
Preventative protocols reviewed and updated unless pt declined. Discussed healthy diet and lifestyle.  

## 2021-01-22 NOTE — Assessment & Plan Note (Signed)
Continues cPAP

## 2021-01-22 NOTE — Patient Instructions (Signed)
You are doing well today Increase ozempic to full dose '1mg'$  weekly  Return as needed or in 6 months for diabetes follow up.  Health Maintenance, Male Adopting a healthy lifestyle and getting preventive care are important in promoting health and wellness. Ask your health care provider about: The right schedule for you to have regular tests and exams. Things you can do on your own to prevent diseases and keep yourself healthy. What should I know about diet, weight, and exercise? Eat a healthy diet  Eat a diet that includes plenty of vegetables, fruits, low-fat dairy products, and lean protein. Do not eat a lot of foods that are high in solid fats, added sugars, or sodium. Maintain a healthy weight Body mass index (BMI) is a measurement that can be used to identify possible weight problems. It estimates body fat based on height and weight. Your health care provider can help determine your BMI and help you achieve or maintain a healthy weight. Get regular exercise Get regular exercise. This is one of the most important things you can do for your health. Most adults should: Exercise for at least 150 minutes each week. The exercise should increase your heart rate and make you sweat (moderate-intensity exercise). Do strengthening exercises at least twice a week. This is in addition to the moderate-intensity exercise. Spend less time sitting. Even light physical activity can be beneficial. Watch cholesterol and blood lipids Have your blood tested for lipids and cholesterol at 48 years of age, then have this test every 5 years. You may need to have your cholesterol levels checked more often if: Your lipid or cholesterol levels are high. You are older than 48 years of age. You are at high risk for heart disease. What should I know about cancer screening? Many types of cancers can be detected early and may often be prevented. Depending on your health history and family history, you may need to have  cancer screening at various ages. This may include screening for: Colorectal cancer. Prostate cancer. Skin cancer. Lung cancer. What should I know about heart disease, diabetes, and high blood pressure? Blood pressure and heart disease High blood pressure causes heart disease and increases the risk of stroke. This is more likely to develop in people who have high blood pressure readings, are of African descent, or are overweight. Talk with your health care provider about your target blood pressure readings. Have your blood pressure checked: Every 3-5 years if you are 73-64 years of age. Every year if you are 59 years old or older. If you are between the ages of 53 and 8 and are a current or former smoker, ask your health care provider if you should have a one-time screening for abdominal aortic aneurysm (AAA). Diabetes Have regular diabetes screenings. This checks your fasting blood sugar level. Have the screening done: Once every three years after age 27 if you are at a normal weight and have a low risk for diabetes. More often and at a younger age if you are overweight or have a high risk for diabetes. What should I know about preventing infection? Hepatitis B If you have a higher risk for hepatitis B, you should be screened for this virus. Talk with your health care provider to find out if you are at risk for hepatitis B infection. Hepatitis C Blood testing is recommended for: Everyone born from [birth year] through 1963. Anyone with known risk factors for hepatitis C. Sexually transmitted infections (STIs) You should be screened each year  for STIs, including gonorrhea and chlamydia, if: You are sexually active and are younger than 48 years of age. You are older than 48 years of age and your health care provider tells you that you are at risk for this type of infection. Your sexual activity has changed since you were last screened, and you are at increased risk for chlamydia or gonorrhea. Ask  your health care provider if you are at risk. Ask your health care provider about whether you are at high risk for HIV. Your health care provider may recommend a prescription medicine to help prevent HIV infection. If you choose to take medicine to prevent HIV, you should first get tested for HIV. You should then be tested every 3 months for as long as you are taking the medicine. Follow these instructions at home: Lifestyle Do not use any products that contain nicotine or tobacco, such as cigarettes, e-cigarettes, and chewing tobacco. If you need help quitting, ask your health care provider. Do not use street drugs. Do not share needles. Ask your health care provider for help if you need support or information about quitting drugs. Alcohol use Do not drink alcohol if your health care provider tells you not to drink. If you drink alcohol: Limit how much you have to 0-2 drinks a day. Be aware of how much alcohol is in your drink. In the [place] one drink equals one 12 oz bottle of beer (355 mL), one 5 oz glass of wine (148 mL), or one 1 oz glass of hard liquor (44 mL). General instructions Schedule regular health, dental, and eye exams. Stay current with your vaccines. Tell your health care provider if: You often feel depressed. You have ever been abused or do not feel safe at home. Summary Adopting a healthy lifestyle and getting preventive care are important in promoting health and wellness. Follow your health care provider's instructions about healthy diet, exercising, and getting tested or screened for diseases. Follow your health care provider's instructions on monitoring your cholesterol and blood pressure. This information is not intended to replace advice given to you by your health care provider. Make sure you discuss any questions you have with your health care provider. Document Revised: 07/17/2020 Document Reviewed: 05/02/2018 Elsevier Patient Education  2022 [employer].

## 2021-01-22 NOTE — Assessment & Plan Note (Signed)
Encouraged healthy diet. 

## 2021-01-22 NOTE — Assessment & Plan Note (Signed)
Chronic, great control on current regimen.  

## 2021-01-22 NOTE — Assessment & Plan Note (Signed)
Chronic, good control on crestor daily  The ASCVD Risk score [name], et al., 2013) failed to calculate for the following reasons:   The valid total cholesterol range is 130 to 320 mg/dL   Unable to determine if patient is Non-Hispanic African American

## 2021-01-22 NOTE — Assessment & Plan Note (Signed)
LFTs stable.

## 2021-01-22 NOTE — Assessment & Plan Note (Signed)
Chronic. A1c trending up. Increase ozempic to '1mg'$  daily, reviewed side effects to watch for.  RTC 6 mo DM f/u visit

## 2021-01-22 NOTE — Progress Notes (Signed)
Patient ID: [name], male    DOB: 03-13-1971, 50 y.o.   MRN: [id number]  This visit was conducted in person.  BP 118/72   Pulse 89   Temp 97.7 F (36.5 C) (Temporal)   Ht _0  (1.753 m)   Wt 228 lb 7 oz (103.6 kg)   SpO2 95%   BMI 33.73 kg/m    CC: CPE Subjective:   HPI: Michael Mcconnell is a 50 y.o. male presenting on 01/22/2021 for Annual Exam   Preventative: COLONOSCOPY 02/2020 - 4 TAs, small hem, rpt 3 yrs Henrene Pastor) Flu shot at work [place] 07/2019, 08/2019, booster 04/2020  Pneumovax 2017  Td 2009, Tdap 10/2017  Seatbelt use discussed  Sunscreen use discussed. No changing moles on skin.  Sleep - averages 7-8 hours/night Ex smoker quit 2008, prior 2-3 cig/day  Alcohol - rare  Dentist Q6 mo Eye exam - yearly    Caffeine: 1 cups/day Lives with step daughter, wife, 2 sons, 2 dogs Occupation: Librarian, academic for [employer] Edu: studying business management through [employer] Activity: weights, treadmill and punching bag at home  Diet: good water, working on fruits/vegetables      Relevant past medical, surgical, family and social history reviewed and updated as indicated. Interim medical history since our last visit reviewed. Allergies and medications reviewed and updated. Outpatient Medications Prior to Visit  Medication Sig Dispense Refill   Blood Glucose Monitoring Suppl (ONE TOUCH ULTRA MINI) w/Device KIT Use as directed 1 each 0   glucose blood (ONE TOUCH ULTRA TEST) test strip Once daily and as needed 100 each 3   Multiple Vitamins-Minerals (MULTIVITAMIN PO) Take 1 capsule by mouth daily.     OneTouch Delica Lancets 18E MISC 1 EACH BY DOES NOT APPLY ROUTE AS DIRECTED. USE AS INSTRUCTED TO CHECK BLOOD SUGAR ONCE DAILY 100 each 11   sildenafil (VIAGRA) 50 MG tablet Take 1 tablet (50 mg total) daily as needed by mouth for erectile dysfunction. 10 tablet 1   citalopram (CELEXA) 10 MG tablet TAKE 1 TABLET BY MOUTH EVERY DAY  90 tablet 0   losartan (COZAAR) 50 MG tablet TAKE 1 TABLET BY MOUTH EVERY DAY 90 tablet 3   losartan-hydrochlorothiazide (HYZAAR) 50-12.5 MG tablet Take 1 tablet by mouth daily. 90 tablet 2   OZEMPIC, 0.25 OR 0.5 MG/DOSE, 2 MG/1.5ML SOPN INJECT 0.5 MILLIGRAM INTO THE SKIN ONCE A WEEK 4.5 mL 3   rosuvastatin (CRESTOR) 10 MG tablet Take 1 tablet (10 mg total) by mouth daily. 90 tablet 3   0.9 %  sodium chloride infusion      No facility-administered medications prior to visit.     Per HPI unless specifically indicated in ROS section below Review of Systems  Constitutional:  Negative for activity change, appetite change, chills, fatigue, fever and unexpected weight change.  HENT:  Negative for hearing loss.   Eyes:  Negative for visual disturbance.  Respiratory:  Negative for cough, chest tightness, shortness of breath and wheezing.   Cardiovascular:  Negative for chest pain, palpitations and leg swelling.  Gastrointestinal:  Negative for abdominal distention, abdominal pain, blood in stool, constipation, diarrhea, nausea and vomiting.  Genitourinary:  Negative for difficulty urinating and hematuria.  Musculoskeletal:  Negative for arthralgias, myalgias and neck pain.  Skin:  Negative for rash.  Neurological:  Negative for dizziness, seizures, syncope and headaches.  Hematological:  Negative for adenopathy. Does not bruise/bleed easily.  Psychiatric/Behavioral:  Negative for dysphoric  mood. The patient is not nervous/anxious.    Objective:  BP 118/72   Pulse 89   Temp 97.7 F (36.5 C) (Temporal)   Ht _0  (1.753 m)   Wt 228 lb 7 oz (103.6 kg)   SpO2 95%   BMI 33.73 kg/m   Wt Readings from Last 3 Encounters:  01/22/21 228 lb 7 oz (103.6 kg)  06/19/20 225 lb 3 oz (102.1 kg)  02/21/20 224 lb (101.6 kg)      Physical Exam Vitals and nursing note reviewed.  Constitutional:      General: He is not in acute distress.    Appearance: Normal appearance. He is well-developed. He is  not ill-appearing.  HENT:     Head: Normocephalic and atraumatic.     Right Ear: Hearing, tympanic membrane, ear canal and external ear normal.     Left Ear: Hearing, tympanic membrane, ear canal and external ear normal.  Eyes:     General: No scleral icterus.    Extraocular Movements: Extraocular movements intact.     Conjunctiva/sclera: Conjunctivae normal.     Pupils: Pupils are equal, round, and reactive to light.  Neck:     Thyroid: No thyroid mass or thyromegaly.  Cardiovascular:     Rate and Rhythm: Normal rate and regular rhythm.     Pulses: Normal pulses.          Radial pulses are 2+ on the right side and 2+ on the left side.     Heart sounds: Normal heart sounds. No murmur heard. Pulmonary:     Effort: Pulmonary effort is normal. No respiratory distress.     Breath sounds: Normal breath sounds. No wheezing, rhonchi or rales.  Abdominal:     General: Bowel sounds are normal. There is no distension.     Palpations: Abdomen is soft. There is no mass.     Tenderness: There is no abdominal tenderness. There is no guarding or rebound.     Hernia: No hernia is present.  Musculoskeletal:        General: Normal range of motion.     Cervical back: Normal range of motion and neck supple.     Right lower leg: No edema.     Left lower leg: No edema.  Lymphadenopathy:     Cervical: No cervical adenopathy.  Skin:    General: Skin is warm and dry.     Findings: No rash.  Neurological:     General: No focal deficit present.     Mental Status: He is alert and oriented to person, place, and time.  Psychiatric:        Mood and Affect: Mood normal.        Behavior: Behavior normal.        Thought Content: Thought content normal.        Judgment: Judgment normal.      Results for orders placed or performed in visit on 12/11/20  Hepatitis C antibody  Result Value Ref Range   Hepatitis C Ab NON-REACTIVE NON-REACTIVE   SIGNAL TO CUT-OFF 0.01 <1.00  Hemoglobin A1c  Result Value  Ref Range   Hgb A1c MFr Bld 7.2 (H) 4.6 - 6.5 %  Comprehensive metabolic panel  Result Value Ref Range   Sodium 142 135 - 145 mEq/L   Potassium 3.9 3.5 - 5.1 mEq/L   Chloride 105 96 - 112 mEq/L   CO2 30 19 - 32 mEq/L   Glucose, Bld 129 (H) 70 - 99  mg/dL   BUN 15 6 - 23 mg/dL   Creatinine, Ser 1.31 0.40 - 1.50 mg/dL   Total Bilirubin 1.5 (H) 0.2 - 1.2 mg/dL   Alkaline Phosphatase 73 39 - 117 U/L   AST 34 0 - 37 U/L   ALT 39 0 - 53 U/L   Total Protein 6.9 6.0 - 8.3 g/dL   Albumin 4.3 3.5 - 5.2 g/dL   GFR 64.54 >60.00 mL/min   Calcium 9.0 8.4 - 10.5 mg/dL  Lipid panel  Result Value Ref Range   Cholesterol 113 0 - 200 mg/dL   Triglycerides 126.0 0.0 - 149.0 mg/dL   HDL 32.50 (L) >39.00 mg/dL   VLDL 25.2 0.0 - 40.0 mg/dL   LDL Cholesterol 55 0 - 99 mg/dL   Total CHOL/HDL Ratio 3    NonHDL 80.21     Assessment & Plan:  This visit occurred during the SARS-CoV-2 public health emergency.  Safety protocols were in place, including screening questions prior to the visit, additional usage of staff PPE, and extensive cleaning of exam room while observing appropriate contact time as indicated for disinfecting solutions.   Problem List Items Addressed This Visit     Health maintenance examination - Primary (Chronic)    Preventative protocols reviewed and updated unless pt declined. Discussed healthy diet and lifestyle.       Controlled diabetes mellitus type 2 with complications (HCC)    Chronic. A1c trending up. Increase ozempic to 40m daily, reviewed side effects to watch for.  RTC 6 mo DM f/u visit       Relevant Medications   losartan-hydrochlorothiazide (HYZAAR) 50-12.5 MG tablet   rosuvastatin (CRESTOR) 10 MG tablet   Semaglutide, 1 MG/DOSE, 4 MG/3ML SOPN   Hypertension    Chronic, great control on current regimen.      Relevant Medications   losartan-hydrochlorothiazide (HYZAAR) 50-12.5 MG tablet   rosuvastatin (CRESTOR) 10 MG tablet   Dyslipidemia associated with  type 2 diabetes mellitus ([place]    Chronic, good control on crestor daily  The ASCVD Risk score (Mikey BussingDC Jr., et al., 2013) failed to calculate for the following reasons:   The valid total cholesterol range is 130 to 320 mg/dL   Unable to determine if patient is Non-Hispanic African American       Relevant Medications   losartan-hydrochlorothiazide (HYZAAR) 50-12.5 MG tablet   rosuvastatin (CRESTOR) 10 MG tablet   Semaglutide, 1 MG/DOSE, 4 MG/3ML SOPN   Obesity, Class I, BMI 30-34.9    Encouraged healthy diet.      NAFLD (nonalcoholic fatty liver disease)    LFTs stable.       Diabetes mellitus with cataract (HCC)   Relevant Medications   losartan-hydrochlorothiazide (HYZAAR) 50-12.5 MG tablet   rosuvastatin (CRESTOR) 10 MG tablet   Semaglutide, 1 MG/DOSE, 4 MG/3ML SOPN   OSA (obstructive sleep apnea)    Continues cPAP        Meds ordered this encounter  Medications   losartan-hydrochlorothiazide (HYZAAR) 50-12.5 MG tablet    Sig: Take 1 tablet by mouth daily.    Dispense:  90 tablet    Refill:  3   rosuvastatin (CRESTOR) 10 MG tablet    Sig: Take 1 tablet (10 mg total) by mouth daily.    Dispense:  90 tablet    Refill:  3   Semaglutide, 1 MG/DOSE, 4 MG/3ML SOPN    Sig: Inject 1 mg as directed once a week.    Dispense:  9 mL  Refill:  3    Note new dose    No orders of the defined types were placed in this encounter.   Patient instructions: You are doing well today Increase ozempic to full dose 56m weekly  Return as needed or in 6 months for diabetes follow up.  Follow up plan: Return in about 6 months (around 07/22/2021) for follow up visit.  JRia Bush MD

## 2021-02-11 DIAGNOSIS — Z20822 Contact with and (suspected) exposure to covid-19: Secondary | ICD-10-CM | POA: Diagnosis not present

## 2021-04-14 ENCOUNTER — Other Ambulatory Visit: Payer: Self-pay | Admitting: Family Medicine

## 2021-04-18 ENCOUNTER — Other Ambulatory Visit: Payer: Self-pay | Admitting: Family Medicine

## 2021-05-12 DIAGNOSIS — G4733 Obstructive sleep apnea (adult) (pediatric): Secondary | ICD-10-CM | POA: Diagnosis not present

## 2021-06-12 DIAGNOSIS — G4733 Obstructive sleep apnea (adult) (pediatric): Secondary | ICD-10-CM | POA: Diagnosis not present

## 2021-07-13 DIAGNOSIS — G4733 Obstructive sleep apnea (adult) (pediatric): Secondary | ICD-10-CM | POA: Diagnosis not present

## 2021-07-16 ENCOUNTER — Other Ambulatory Visit: Payer: Self-pay | Admitting: Family Medicine

## 2021-07-23 ENCOUNTER — Encounter: Payer: Self-pay | Admitting: Family Medicine

## 2021-07-23 ENCOUNTER — Other Ambulatory Visit: Payer: Self-pay

## 2021-07-23 ENCOUNTER — Ambulatory Visit: Payer: BC Managed Care – PPO | Admitting: Family Medicine

## 2021-07-23 VITALS — BP 120/84 | HR 91 | Temp 97.4°F | Ht 69.0 in | Wt 233.1 lb

## 2021-07-23 DIAGNOSIS — E669 Obesity, unspecified: Secondary | ICD-10-CM

## 2021-07-23 DIAGNOSIS — E118 Type 2 diabetes mellitus with unspecified complications: Secondary | ICD-10-CM | POA: Diagnosis not present

## 2021-07-23 LAB — POCT GLYCOSYLATED HEMOGLOBIN (HGB A1C): Hemoglobin A1C: 6.9 % — AB (ref 4.0–5.6)

## 2021-07-23 MED ORDER — METAMUCIL 0.52 G PO CAPS: 0.5200 g | ORAL_CAPSULE | Freq: Two times a day (BID) | ORAL | Status: AC

## 2021-07-23 MED ORDER — GLUCOSE BLOOD VI STRP: ORAL_STRIP | 3 refills | Status: DC

## 2021-07-23 MED ORDER — SEMAGLUTIDE (1 MG/DOSE) 4 MG/3ML ~~LOC~~ SOPN: 1.0000 mg | PEN_INJECTOR | SUBCUTANEOUS | 3 refills | Status: DC

## 2021-07-23 MED ORDER — CITALOPRAM HYDROBROMIDE 10 MG PO TABS: 10.0000 mg | ORAL_TABLET | Freq: Every day | ORAL | 1 refills | Status: DC

## 2021-07-23 NOTE — Assessment & Plan Note (Addendum)
Chronic, sugar at goal based on recent A1c.  ?Encouraged portion control and incorporating regular aerobic exercise into routine.  ?Advised he schedule eye exam as due.  ?RTC 6 mo CPE.  ?

## 2021-07-23 NOTE — Patient Instructions (Addendum)
You are doing well today. ?Schedule eye exam as you're due.  ?Work on portion sizes and regular exercise routine.  ?Return in 6 months for physical.  ?

## 2021-07-23 NOTE — Assessment & Plan Note (Signed)
Encouraged healthy diet and lifestyle choices to affect sustainable weight loss.  ?

## 2021-07-23 NOTE — Progress Notes (Signed)
? ? Patient ID: Michael Mcconnell, male    DOB: Oct 28, 1970, 51 y.o.   MRN: [id number] ? ?This visit was conducted in person. ? ?BP 120/84   Pulse 91   Temp (!) 97.4 ?F (36.3 ?C) (Temporal)   Ht _0  (1.753 m)   Wt 233 lb 2 oz (105.7 kg)   SpO2 95%   BMI 34.43 kg/m?   ? ?CC: DM f/u visit  ?Subjective:  ? ?HPI: ?Michael Mcconnell is a 51 y.o. male presenting on 07/23/2021 for Diabetes (Here for 6 mo f/u.) ? ? ?Struggling with weight.  ? ?DM - does regularly check fasting sugars: 130s. Compliant with antihyperglycemic regimen which includes: ozempic 88m weekly without nausea or constipation - he does take metamucil daily. No exercise routines. Limited dietary modifications due to work schedule. Works 10hr days. He is preparing lunch regularly (usually leftovers from the night before). He skips breakfast. Denies low sugars or hypoglycemic symptoms. Denies paresthesias, blurry vision. Last diabetic eye exam 06/2019 - DUE. Glucometer brand: One [employer], [employer] Last foot exam: 05/2020 - due. DSME: 2019 at [facility].  ?Lab Results  ?Component Value Date  ? HGBA1C 6.9 (A) 07/23/2021  ? ?Diabetic Foot Exam - Simple   ?Simple Foot Form ?Diabetic Foot exam was performed with the following findings: Yes 07/23/2021  7:56 AM  ?Visual Inspection ?See comments: Yes ?Sensation Testing ?Intact to touch and monofilament testing bilaterally: Yes ?Pulse Check ?See comments: Yes ?Comments ?1+ DP bilaterally ?Icthyosis rash/dry skin to bilateral lateral ankles  ?Reticular veins to medial ankles bilaterally ?  ? ?Lab Results  ?Component Value Date  ? MICROALBUR 5.2 (H) 09/11/2015  ?  ? ?   ? ?Relevant past medical, surgical, family and social history reviewed and updated as indicated. Interim medical history since our last visit reviewed. ?Allergies and medications reviewed and updated. ?Outpatient Medications Prior to Visit  ?Medication Sig Dispense Refill  ? Blood Glucose Monitoring Suppl (ONE TOUCH ULTRA MINI) w/Device KIT Use as directed 1 each 0   ? losartan-hydrochlorothiazide (HYZAAR) 50-12.5 MG tablet Take 1 tablet by mouth daily. 90 tablet 3  ? Multiple Vitamins-Minerals (MULTIVITAMIN PO) Take 1 capsule by mouth daily.    ? OneTouch Delica Lancets 394TMISC 1 EACH BY DOES NOT APPLY ROUTE AS DIRECTED. USE AS INSTRUCTED TO CHECK BLOOD SUGAR ONCE DAILY 100 each 11  ? rosuvastatin (CRESTOR) 10 MG tablet Take 1 tablet (10 mg total) by mouth daily. 90 tablet 3  ? sildenafil (VIAGRA) 50 MG tablet Take 1 tablet (50 mg total) daily as needed by mouth for erectile dysfunction. 10 tablet 1  ? citalopram (CELEXA) 10 MG tablet TAKE 1 TABLET BY MOUTH EVERY DAY 90 tablet 0  ? glucose blood (ONE TOUCH ULTRA TEST) test strip Once daily and as needed 100 each 3  ? Semaglutide, 1 MG/DOSE, 4 MG/3ML SOPN Inject 1 mg as directed once a week. 9 mL 3  ? ?No facility-administered medications prior to visit.  ?  ? ?Per HPI unless specifically indicated in ROS section below ?Review of Systems ? ?Objective:  ?BP 120/84   Pulse 91   Temp (!) 97.4 ?F (36.3 ?C) (Temporal)   Ht _1  (1.753 m)   Wt 233 lb 2 oz (105.7 kg)   SpO2 95%   BMI 34.43 kg/m?   ?Wt Readings from Last 3 Encounters:  ?07/23/21 233 lb 2 oz (105.7 kg)  ?01/22/21 228 lb 7 oz (103.6 kg)  ?06/19/20 225 lb 3 oz (102.1  kg)  ?  ?  ?Physical Exam ?Vitals and nursing note reviewed.  ?Constitutional:   ?   Appearance: Normal appearance. He is not ill-appearing.  ?Eyes:  ?   Extraocular Movements: Extraocular movements intact.  ?   Conjunctiva/sclera: Conjunctivae normal.  ?   Pupils: Pupils are equal, round, and reactive to light.  ?Cardiovascular:  ?   Rate and Rhythm: Normal rate and regular rhythm.  ?   Pulses: Normal pulses.  ?   Heart sounds: Normal heart sounds. No murmur heard. ?Pulmonary:  ?   Effort: Pulmonary effort is normal. No respiratory distress.  ?   Breath sounds: Normal breath sounds. No wheezing, rhonchi or rales.  ?Musculoskeletal:  ?   Right lower leg: No edema.  ?   Left lower leg: No edema.  ?    Comments: See HPI for foot exam if done  ?Skin: ?   General: Skin is warm and dry.  ?   Findings: No rash.  ?Neurological:  ?   Mental Status: He is alert.  ?Psychiatric:     ?   Mood and Affect: Mood normal.     ?   Behavior: Behavior normal.  ? ?   ?Results for orders placed or performed in visit on 07/23/21  ?POCT glycosylated hemoglobin (Hb A1C)  ?Result Value Ref Range  ? Hemoglobin A1C 6.9 (A) 4.0 - 5.6 %  ? HbA1c POC (<> result, manual entry)    ? HbA1c, POC (prediabetic range)    ? HbA1c, POC (controlled diabetic range)    ? ? ?Assessment & Plan:  ?This visit occurred during the SARS-CoV-2 public health emergency.  Safety protocols were in place, including screening questions prior to the visit, additional usage of staff PPE, and extensive cleaning of exam room while observing appropriate contact time as indicated for disinfecting solutions.  ? ?Problem List Items Addressed This Visit   ? ? Controlled diabetes mellitus type 2 with complications ([place]) - Primary  ?  Chronic, sugar at goal based on recent A1c.  ?Encouraged portion control and incorporating regular aerobic exercise into routine.  ?Advised he schedule eye exam as due.  ?RTC 6 mo CPE.  ?  ?  ? Relevant Medications  ? Semaglutide, 1 MG/DOSE, 4 MG/3ML SOPN  ? Other Relevant Orders  ? POCT glycosylated hemoglobin (Hb A1C) (Completed)  ? Obesity, Class I, BMI 30-34.9  ?  Encouraged healthy diet and lifestyle choices to affect sustainable weight loss.  ?  ?  ?  ? ?Meds ordered this encounter  ?Medications  ? Semaglutide, 1 MG/DOSE, 4 MG/3ML SOPN  ?  Sig: Inject 1 mg as directed once a week.  ?  Dispense:  9 mL  ?  Refill:  3  ?  Note new dose  ? psyllium (METAMUCIL) 0.52 g capsule  ?  Sig: Take 1 capsule (0.52 g total) by mouth in the morning and at bedtime.  ? glucose blood (ONE TOUCH ULTRA TEST) test strip  ?  Sig: Once daily and as needed  ?  Dispense:  100 each  ?  Refill:  3  ? citalopram (CELEXA) 10 MG tablet  ?  Sig: Take 1 tablet (10 mg total)  by mouth daily.  ?  Dispense:  90 tablet  ?  Refill:  1  ? ?Orders Placed This Encounter  ?Procedures  ? POCT glycosylated hemoglobin (Hb A1C)  ? ? ? ?Patient Instructions  ?You are doing well today. ?Schedule eye exam as you're  due.  ?Work on portion sizes and regular exercise routine.  ?Return in 6 months for physical.  ? ?Follow up plan: ?Return in about 6 months (around 01/23/2022) for annual exam, prior fasting for blood work. ? ?Ria Bush, MD   ?

## 2021-10-06 ENCOUNTER — Telehealth: Payer: Self-pay | Admitting: Family Medicine

## 2021-10-06 NOTE — Telephone Encounter (Signed)
Received request from [facility] asking for a physician order stating patient cannot tolerate CPAP however I believe he has been using his CPAP regularly. ?Please verify with patient he requested this and check on reason why he wants to use a "sleep appliance" which I assume is a dental appliance instead of his regular CPAP for sleep apnea ?

## 2021-10-07 NOTE — Telephone Encounter (Signed)
Lvm asking pt to call back.  Need to relay Dr. G's message.  

## 2021-10-08 NOTE — Telephone Encounter (Signed)
Lvm asking pt to call back.  Need to relay Dr. G's message.  

## 2021-10-12 NOTE — Telephone Encounter (Signed)
Lvm asking pt to call back.  Need to relay Dr. G's message.   Mailing a letter.  

## 2021-10-13 DIAGNOSIS — G4733 Obstructive sleep apnea (adult) (pediatric): Secondary | ICD-10-CM | POA: Diagnosis not present

## 2021-10-20 DIAGNOSIS — E119 Type 2 diabetes mellitus without complications: Secondary | ICD-10-CM | POA: Diagnosis not present

## 2021-10-20 DIAGNOSIS — H35033 Hypertensive retinopathy, bilateral: Secondary | ICD-10-CM | POA: Diagnosis not present

## 2021-10-20 LAB — HM DIABETES EYE EXAM

## 2021-10-25 ENCOUNTER — Encounter: Payer: Self-pay | Admitting: Family Medicine

## 2021-11-13 DIAGNOSIS — G4733 Obstructive sleep apnea (adult) (pediatric): Secondary | ICD-10-CM | POA: Diagnosis not present

## 2021-12-05 ENCOUNTER — Other Ambulatory Visit: Payer: Self-pay | Admitting: Family Medicine

## 2021-12-13 DIAGNOSIS — G4733 Obstructive sleep apnea (adult) (pediatric): Secondary | ICD-10-CM | POA: Diagnosis not present

## 2022-01-15 ENCOUNTER — Other Ambulatory Visit: Payer: Self-pay | Admitting: Family Medicine

## 2022-01-15 DIAGNOSIS — E1169 Type 2 diabetes mellitus with other specified complication: Secondary | ICD-10-CM

## 2022-01-15 DIAGNOSIS — E118 Type 2 diabetes mellitus with unspecified complications: Secondary | ICD-10-CM

## 2022-01-19 ENCOUNTER — Other Ambulatory Visit (INDEPENDENT_AMBULATORY_CARE_PROVIDER_SITE_OTHER): Payer: BC Managed Care – PPO

## 2022-01-19 DIAGNOSIS — E118 Type 2 diabetes mellitus with unspecified complications: Secondary | ICD-10-CM

## 2022-01-19 DIAGNOSIS — E1169 Type 2 diabetes mellitus with other specified complication: Secondary | ICD-10-CM | POA: Diagnosis not present

## 2022-01-19 DIAGNOSIS — E785 Hyperlipidemia, unspecified: Secondary | ICD-10-CM

## 2022-01-19 LAB — COMPREHENSIVE METABOLIC PANEL
ALT: 23 U/L (ref 0–53)
AST: 25 U/L (ref 0–37)
Albumin: 4.5 g/dL (ref 3.5–5.2)
Alkaline Phosphatase: 69 U/L (ref 39–117)
BUN: 15 mg/dL (ref 6–23)
CO2: 28 mEq/L (ref 19–32)
Calcium: 9.5 mg/dL (ref 8.4–10.5)
Chloride: 106 mEq/L (ref 96–112)
Creatinine, Ser: 1.44 mg/dL (ref 0.40–1.50)
GFR: 57.17 mL/min — ABNORMAL LOW (ref >60.00)
Glucose, Bld: 116 mg/dL — ABNORMAL HIGH (ref 70–99)
Potassium: 3.9 mEq/L (ref 3.5–5.1)
Sodium: 142 mEq/L (ref 135–145)
Total Bilirubin: 1.6 mg/dL — ABNORMAL HIGH (ref 0.2–1.2)
Total Protein: 7.3 g/dL (ref 6.0–8.3)

## 2022-01-19 LAB — MICROALBUMIN / CREATININE URINE RATIO
Creatinine,U: 169.3 mg/dL
Microalb Creat Ratio: 1.4 mg/g (ref 0.0–30.0)
Microalb, Ur: 2.3 mg/dL — ABNORMAL HIGH (ref 0.0–1.9)

## 2022-01-19 LAB — HEMOGLOBIN A1C: Hgb A1c MFr Bld: 6.9 % — ABNORMAL HIGH (ref 4.6–6.5)

## 2022-01-19 LAB — LIPID PANEL
Cholesterol: 121 mg/dL (ref 0–200)
HDL: 35.6 mg/dL — ABNORMAL LOW (ref >39.00)
LDL Cholesterol: 58 mg/dL (ref 0–99)
NonHDL: 85.78
Total CHOL/HDL Ratio: 3
Triglycerides: 137 mg/dL (ref 0.0–149.0)
VLDL: 27.4 mg/dL (ref 0.0–40.0)

## 2022-01-26 ENCOUNTER — Ambulatory Visit (INDEPENDENT_AMBULATORY_CARE_PROVIDER_SITE_OTHER): Payer: BC Managed Care – PPO | Admitting: Family Medicine

## 2022-01-26 ENCOUNTER — Encounter: Payer: Self-pay | Admitting: Family Medicine

## 2022-01-26 VITALS — BP 138/84 | HR 87 | Temp 97.4°F | Ht 69.25 in | Wt 226.0 lb

## 2022-01-26 DIAGNOSIS — Z0000 Encounter for general adult medical examination without abnormal findings: Secondary | ICD-10-CM

## 2022-01-26 DIAGNOSIS — R4589 Other symptoms and signs involving emotional state: Secondary | ICD-10-CM

## 2022-01-26 DIAGNOSIS — E1122 Type 2 diabetes mellitus with diabetic chronic kidney disease: Secondary | ICD-10-CM

## 2022-01-26 DIAGNOSIS — K760 Fatty (change of) liver, not elsewhere classified: Secondary | ICD-10-CM

## 2022-01-26 DIAGNOSIS — E669 Obesity, unspecified: Secondary | ICD-10-CM

## 2022-01-26 DIAGNOSIS — M25511 Pain in right shoulder: Secondary | ICD-10-CM

## 2022-01-26 DIAGNOSIS — E1169 Type 2 diabetes mellitus with other specified complication: Secondary | ICD-10-CM

## 2022-01-26 DIAGNOSIS — Z23 Encounter for immunization: Secondary | ICD-10-CM

## 2022-01-26 DIAGNOSIS — G8929 Other chronic pain: Secondary | ICD-10-CM

## 2022-01-26 DIAGNOSIS — E118 Type 2 diabetes mellitus with unspecified complications: Secondary | ICD-10-CM | POA: Diagnosis not present

## 2022-01-26 DIAGNOSIS — I10 Essential (primary) hypertension: Secondary | ICD-10-CM

## 2022-01-26 DIAGNOSIS — N183 Chronic kidney disease, stage 3 unspecified: Secondary | ICD-10-CM

## 2022-01-26 DIAGNOSIS — K648 Other hemorrhoids: Secondary | ICD-10-CM

## 2022-01-26 DIAGNOSIS — E785 Hyperlipidemia, unspecified: Secondary | ICD-10-CM

## 2022-01-26 DIAGNOSIS — G4733 Obstructive sleep apnea (adult) (pediatric): Secondary | ICD-10-CM

## 2022-01-26 MED ORDER — SEMAGLUTIDE (1 MG/DOSE) 4 MG/3ML ~~LOC~~ SOPN: 1.0000 mg | PEN_INJECTOR | SUBCUTANEOUS | 3 refills | Status: DC

## 2022-01-26 MED ORDER — LOSARTAN POTASSIUM-HCTZ 50-12.5 MG PO TABS
1.0000 | ORAL_TABLET | Freq: Every day | ORAL | 3 refills | Status: DC
Start: 2022-01-26 — End: 2022-08-29

## 2022-01-26 MED ORDER — ROSUVASTATIN CALCIUM 10 MG PO TABS
10.0000 mg | ORAL_TABLET | Freq: Every day | ORAL | 3 refills | Status: DC
Start: 2022-01-26 — End: 2023-01-31

## 2022-01-26 MED ORDER — CITALOPRAM HYDROBROMIDE 10 MG PO TABS
10.0000 mg | ORAL_TABLET | Freq: Every day | ORAL | 3 refills | Status: DC
Start: 2022-01-26 — End: 2023-01-31

## 2022-01-26 NOTE — Assessment & Plan Note (Signed)
Reviewing records over the past 10 yrs, GFR consistently 50-60s.  Discussed CKD likely diabetes and HTN related.  rec good water intake, avoiding prolonged NSAID use.  Will continue to monitor.  Consider SGLT2i in future.

## 2022-01-26 NOTE — Assessment & Plan Note (Signed)
Has been using CPAP machine but not satisfied with its use.  Some claustrophobia even with nasal pillow, difficulty with concentration leading to nasal drainage. Interested in reevaluation for inspire implantable device.  Previously saw Dr. Wilburn Cornelia, we will refer back to ENT.

## 2022-01-26 NOTE — Assessment & Plan Note (Signed)
Recent LFTs stable.

## 2022-01-26 NOTE — Assessment & Plan Note (Addendum)
Discussed dietary measures to avoid constipation - and rec daily fiber supplement.

## 2022-01-26 NOTE — Assessment & Plan Note (Signed)
Story/exam suspicious for RTC injury - supraspinatus tendonitis or tear. Also has marked pain with rotation of humeral head in [facility] joint - ?labral tear. I provided patient with RTC injury exercises, rec ibuprofen '600mg'$  BID with meals x 5 days. Limit NSAID in CKD. I asked him to see Dr Lorelei Pont sports medicine for further evaluation. Pt agrees with plan.

## 2022-01-26 NOTE — Assessment & Plan Note (Signed)
Preventative protocols reviewed and updated unless pt declined. Discussed healthy diet and lifestyle.  

## 2022-01-26 NOTE — Assessment & Plan Note (Signed)
Chronic, stable on current regimen - continue. 

## 2022-01-26 NOTE — Addendum Note (Signed)
Addended by: Ria Bush on: 01/26/2022 10:08 AM   Modules accepted: Orders

## 2022-01-26 NOTE — Assessment & Plan Note (Signed)
Encouraged healthy diet and lifestyle choices to affect sustainable weight loss.  ?

## 2022-01-26 NOTE — Progress Notes (Addendum)
Patient ID: Michael Mcconnell, male    DOB: February 27, 1971, 51 y.o.   MRN: [id number]  This visit was conducted in person.  BP 138/84   Pulse 87   Temp (!) 97.4 F (36.3 C) (Temporal)   Ht 5' 9.25" (1.759 m)   Wt 226 lb (102.5 kg)   SpO2 96%   BMI 33.13 kg/m    CC: CPE Subjective:   HPI: Michael Mcconnell is a 51 y.o. male presenting on 01/26/2022 for Annual Exam   OSA - saw pulm Elsworth Soho) and ENT Wilburn Cornelia) - currently using CPAP machine but not satisfied with this - condensation, claustrophobia symptoms to nasal pillow. Interested in further evaluation for Inspire implantable device. Requests new ENT referral to [facility].   3 mo h/o R shoulder pain with radiation down arm, notes anterior shoulder pain, associated with weakness. Treating with ibuprofen 410m 1-2x/day, with some benefit. Denies inciting trauma/injury or falls. Worse with certain movements - ie cranking lawnmower or weed eater., scratching back, putting on shirt. Worse at night when laying on shoulder.   Preventative: COLONOSCOPY 02/2020 - 4 TAs, small hem, rpt 3 yrs (Henrene Pastor Flu shot [place], 08/2019, booster 04/2020, bivalent 03/2021 Pneumovax 2017  Td 2009, Tdap 10/2017  Seatbelt use discussed  Sunscreen use discussed. No changing moles on skin.  Sleep - averages 7-8 hours/night Ex smoker quit 2008, prior 2-3 cig/day  Alcohol - rare  Dentist Q6 mo  Eye exam - yearly   Caffeine: 1 cups/day Lives with step daughter, wife, 2 sons, 2 dogs Occupation: sLibrarian, academicfor [employer]: studying business management through [employer]: weights, treadmill and punching bag at home  Diet: good water, working on fruits/vegetables      Relevant past medical, surgical, family and social history reviewed and updated as indicated. Interim medical history since our last visit reviewed. Allergies and medications reviewed and updated. Outpatient Medications Prior to Visit   Medication Sig Dispense Refill   Blood Glucose Monitoring Suppl (ONE TOUCH ULTRA MINI) w/Device KIT Use as directed 1 each 0   citalopram (CELEXA) 10 MG tablet Take 1 tablet (10 mg total) by mouth daily. 90 tablet 1   glucose blood (ONE TOUCH ULTRA TEST) test strip Once daily and as needed 100 each 3   losartan-hydrochlorothiazide (HYZAAR) 50-12.5 MG tablet TAKE 1 TABLET BY MOUTH EVERY DAY 90 tablet 0   Multiple Vitamins-Minerals (MULTIVITAMIN PO) Take 1 capsule by mouth daily.     OneTouch Delica Lancets 358PMISC 1 EACH BY DOES NOT APPLY ROUTE AS DIRECTED. USE AS INSTRUCTED TO CHECK BLOOD SUGAR ONCE DAILY 100 each 11   psyllium (METAMUCIL) 0.52 g capsule Take 1 capsule (0.52 g total) by mouth in the morning and at bedtime.     rosuvastatin (CRESTOR) 10 MG tablet Take 1 tablet (10 mg total) by mouth daily. 90 tablet 3   Semaglutide, 1 MG/DOSE, 4 MG/3ML SOPN Inject 1 mg as directed once a week. 9 mL 3   sildenafil (VIAGRA) 50 MG tablet Take 1 tablet (50 mg total) daily as needed by mouth for erectile dysfunction. 10 tablet 1   No facility-administered medications prior to visit.     Per HPI unless specifically indicated in ROS section below Review of Systems  Constitutional:  Negative for activity change, appetite change, chills, fatigue, fever and unexpected weight change.  HENT:  Negative for hearing loss.   Eyes:  Negative for visual disturbance.  Respiratory:  Negative for cough, chest tightness, shortness of breath and wheezing.   Cardiovascular:  Negative for chest pain, palpitations and leg swelling.  Gastrointestinal:  Positive for blood in stool (occ with wiping). Negative for abdominal distention, abdominal pain, constipation, diarrhea, nausea and vomiting.  Genitourinary:  Negative for difficulty urinating and hematuria.  Musculoskeletal:  Negative for arthralgias, myalgias and neck pain.  Skin:  Negative for rash.  Neurological:  Negative for dizziness, seizures, syncope and  headaches.  Hematological:  Negative for adenopathy. Does not bruise/bleed easily.  Psychiatric/Behavioral:  Negative for dysphoric mood. The patient is not nervous/anxious.     Objective:  BP 138/84   Pulse 87   Temp (!) 97.4 F (36.3 C) (Temporal)   Ht 5' 9.25" (1.759 m)   Wt 226 lb (102.5 kg)   SpO2 96%   BMI 33.13 kg/m   Wt Readings from Last 3 Encounters:  01/26/22 226 lb (102.5 kg)  07/23/21 233 lb 2 oz (105.7 kg)  01/22/21 228 lb 7 oz (103.6 kg)      Physical Exam Vitals and nursing note reviewed.  Constitutional:      General: He is not in acute distress.    Appearance: Normal appearance. He is well-developed. He is not ill-appearing.  HENT:     Head: Normocephalic and atraumatic.     Right Ear: Hearing, tympanic membrane, ear canal and external ear normal.     Left Ear: Hearing, tympanic membrane, ear canal and external ear normal.  Eyes:     General: No scleral icterus.    Extraocular Movements: Extraocular movements intact.     Conjunctiva/sclera: Conjunctivae normal.     Pupils: Pupils are equal, round, and reactive to light.  Neck:     Thyroid: No thyroid mass or thyromegaly.  Cardiovascular:     Rate and Rhythm: Normal rate and regular rhythm.     Pulses: Normal pulses.          Radial pulses are 2+ on the right side and 2+ on the left side.     Heart sounds: Normal heart sounds. No murmur heard. Pulmonary:     Effort: Pulmonary effort is normal. No respiratory distress.     Breath sounds: Normal breath sounds. No wheezing, rhonchi or rales.  Abdominal:     General: Bowel sounds are normal. There is no distension.     Palpations: Abdomen is soft. There is no mass.     Tenderness: There is no abdominal tenderness. There is no guarding or rebound.     Hernia: No hernia is present.  Musculoskeletal:        General: Normal range of motion.     Cervical back: Normal range of motion and neck supple.     Right lower leg: No edema.     Left lower leg: No  edema.     Comments:  R handed L shoulder WNL R shoulder exam: No deformity of shoulders on inspection. No significant pain with palpation of shoulder landmarks. FROM in abduction and forward flexion but marked pain with abduction past ~80 degrees. Mild discomfort with testing SITS in ext/int rotation. ++ pain with empty can sign. Neg Speed test. No significant impingement appreciated. + pain with rotation of humeral head in [facility] joint, with popping.   Lymphadenopathy:     Cervical: No cervical adenopathy.  Skin:    General: Skin is warm and dry.     Findings: No rash.  Neurological:     General: No focal deficit  present.     Mental Status: He is alert and oriented to person, place, and time.  Psychiatric:        Mood and Affect: Mood normal.        Behavior: Behavior normal.        Thought Content: Thought content normal.        Judgment: Judgment normal.       Results for orders placed or performed in visit on 01/19/22  Microalbumin / creatinine urine ratio  Result Value Ref Range   Microalb, Ur 2.3 (H) 0.0 - 1.9 mg/dL   Creatinine,U 169.3 mg/dL   Microalb Creat Ratio 1.4 0.0 - 30.0 mg/g  Hemoglobin A1c  Result Value Ref Range   Hgb A1c MFr Bld 6.9 (H) 4.6 - 6.5 %  Comprehensive metabolic panel  Result Value Ref Range   Sodium 142 135 - 145 mEq/L   Potassium 3.9 3.5 - 5.1 mEq/L   Chloride 106 96 - 112 mEq/L   CO2 28 19 - 32 mEq/L   Glucose, Bld 116 (H) 70 - 99 mg/dL   BUN 15 6 - 23 mg/dL   Creatinine, Ser 1.44 0.40 - 1.50 mg/dL   Total Bilirubin 1.6 (H) 0.2 - 1.2 mg/dL   Alkaline Phosphatase 69 39 - 117 U/L   AST 25 0 - 37 U/L   ALT 23 0 - 53 U/L   Total Protein 7.3 6.0 - 8.3 g/dL   Albumin 4.5 3.5 - 5.2 g/dL   GFR 57.17 (L) >60.00 mL/min   Calcium 9.5 8.4 - 10.5 mg/dL  Lipid panel  Result Value Ref Range   Cholesterol 121 0 - 200 mg/dL   Triglycerides 137.0 0.0 - 149.0 mg/dL   HDL 35.60 (L) >39.00 mg/dL   VLDL 27.4 0.0 - 40.0 mg/dL   LDL Cholesterol 58 0  - 99 mg/dL   Total CHOL/HDL Ratio 3    NonHDL 85.78       01/26/2022    9:51 AM 01/22/2021    4:00 PM 12/13/2019   11:07 AM 11/22/2019    8:51 AM 06/07/2019   10:23 AM  Depression screen PHQ 2/9  Decreased Interest '2 1 1 ' 0 2  Down, Depressed, Hopeless '1 1 1 1 1  ' PHQ - 2 Score '3 2 2 1 3  ' Altered sleeping 1 0 1 0 0  Tired, decreased energy '1 1 1 1 ' 0  Change in appetite '1 1 1 1 1  ' Feeling bad or failure about yourself  1 1 0 0 1  Trouble concentrating 1 0 0 1 0  Moving slowly or fidgety/restless 0 0 0 0 0  Suicidal thoughts 0 0 0 0 0  PHQ-9 Score '8 5 5 4 5  ' Difficult doing work/chores Somewhat difficult           01/26/2022    9:51 AM 01/22/2021    4:00 PM 12/13/2019   11:07 AM 11/22/2019    8:51 AM  GAD 7 : Generalized Anxiety Score  Nervous, Anxious, on Edge '1 1 1 1  ' Control/stop worrying 1 0 0 1  Worry too much - different things 1 0 1 0  Trouble relaxing 1 1 0 0  Restless 0 0 0 0  Easily annoyed or irritable  '1 1 1  ' Afraid - awful might happen 1 0 1 1  Total GAD 7 Score  '3 4 4  ' Anxiety Difficulty Somewhat difficult      Assessment & Plan:   Problem List  Items Addressed This Visit     Health maintenance examination - Primary (Chronic)    Preventative protocols reviewed and updated unless pt declined. Discussed healthy diet and lifestyle.       Controlled diabetes mellitus type 2 with complications (HCC)    Chronic, stable based on A1c. Encouraged renewed efforts to follow diabetic diet. Continue ozempic 26m weekly.       Hypertension    Chronic, stable on current regimen - continue.       Dyslipidemia associated with type 2 diabetes mellitus (HCC)    Chronic, stable on current regimen - continue rosuvastatin 157mdaily. The ASCVD Risk score (Arnett DK, et al., 2019) failed to calculate for the following reasons:   The valid total cholesterol range is 130 to 320 mg/dL   Unable to determine if patient is Non-Hispanic African American       Internal hemorrhoid     Discussed dietary measures to avoid constipation - and rec daily fiber supplement.       Obesity, Class I, BMI 30-34.9    Encouraged healthy diet and lifestyle choices to affect sustainable weight loss.      NAFLD (nonalcoholic fatty liver disease)    Recent LFTs stable.      OSA (obstructive sleep apnea)    Has been using CPAP machine but not satisfied with its use.  Some claustrophobia even with nasal pillow, difficulty with concentration leading to nasal drainage. Interested in reevaluation for inspire implantable device.  Previously saw Dr. ShWilburn Corneliawe will refer back to ENT.      Relevant Orders   Ambulatory referral to ENT   Depressed mood    Stable period on celexa 1059maily - continue.       Right shoulder pain    Story/exam suspicious for RTC injury - supraspinatus tendonitis or tear. Also has marked [facility] ?[facility]. I provided patient with RTC injury exercises, rec ibuprofen 600m19mD with meals x 5 days. Limit NSAID in CKD. I asked him to see Dr CoplLorelei Pontrts medicine for further evaluation. Pt agrees with plan.       CKD stage 3 due to type 2 diabetes mellitus (HCC)[facility] Reviewing records over the past 10 yrs, GFR consistently 50-60s.  Discussed CKD likely diabetes and HTN related.  rec good water intake, avoiding prolonged NSAID use.  Will continue to monitor.  Consider SGLT2i in future.       Other Visit Diagnoses     Need for influenza vaccination       Relevant Orders   Flu Vaccine QUAD 59mo+23moFluarix, Fluzone & Alfiuria Quad PF) (Completed)        No orders of the defined types were placed in this encounter.  Orders Placed This Encounter  Procedures   Flu Vaccine QUAD 59mo+I95moluarix, Fluzone & Alfiuria Quad PF)   Ambulatory referral to ENT    Referral Priority:   Routine    Referral Type:   Consultation    Referral Reason:   Specialty Services Required    Requested Specialty:   Otolaryngology     Number of Visits Requested:   1     Patient instructions: Flu shot today. We will refer you back to ENT to further discuss Inspire implantable device.  Try exercises provided today for rotator cuff injury. Take ibuprofen 600mg t31m daily with meals for 5 days. I do recommend coming back to see Dr CoplandLorelei Pont  medicine.  Return in 6 months for diabetes follow up visit   Follow up plan: Return in about 6 months (around 07/27/2022) for follow up visit.  Ria Bush, MD

## 2022-01-26 NOTE — Patient Instructions (Addendum)
Flu shot today. We will refer you back to ENT to further discuss Inspire implantable device.  Try exercises provided today for rotator cuff injury. Take ibuprofen '600mg'$  twice daily with meals for 5 days. I do recommend coming back to see Dr Lorelei Pont sports medicine.  Return in 6 months for diabetes follow up visit   Health Maintenance, Male Adopting a healthy lifestyle and getting preventive care are important in promoting health and wellness. Ask your health care provider about: The right schedule for you to have regular tests and exams. Things you can do on your own to prevent diseases and keep yourself healthy. What should I know about diet, weight, and exercise? Eat a healthy diet  Eat a diet that includes plenty of vegetables, fruits, low-fat dairy products, and lean protein. Do not eat a lot of foods that are high in solid fats, added sugars, or sodium. Maintain a healthy weight Body mass index (BMI) is a measurement that can be used to identify possible weight problems. It estimates body fat based on height and weight. Your health care provider can help determine your BMI and help you achieve or maintain a healthy weight. Get regular exercise Get regular exercise. This is one of the most important things you can do for your health. Most adults should: Exercise for at least 150 minutes each week. The exercise should increase your heart rate and make you sweat (moderate-intensity exercise). Do strengthening exercises at least twice a week. This is in addition to the moderate-intensity exercise. Spend less time sitting. Even light physical activity can be beneficial. Watch cholesterol and blood lipids Have your blood tested for lipids and cholesterol at 49 years of age, then have this test every 5 years. You may need to have your cholesterol levels checked more often if: Your lipid or cholesterol levels are high. You are older than 49 years of age. You are at high risk for heart  disease. What should I know about cancer screening? Many types of cancers can be detected early and may often be prevented. Depending on your health history and family history, you may need to have cancer screening at various ages. This may include screening for: Colorectal cancer. Prostate cancer. Skin cancer. Lung cancer. What should I know about heart disease, diabetes, and high blood pressure? Blood pressure and heart disease High blood pressure causes heart disease and increases the risk of stroke. This is more likely to develop in people who have high blood pressure readings or are overweight. Talk with your health care provider about your target blood pressure readings. Have your blood pressure checked: Every 3-5 years if you are 75-60 years of age. Every year if you are 64 years old or older. If you are between the ages of 82 and 70 and are a current or former smoker, ask your health care provider if you should have a one-time screening for abdominal aortic aneurysm (AAA). Diabetes Have regular diabetes screenings. This checks your fasting blood sugar level. Have the screening done: Once every three years after age 23 if you are at a normal weight and have a low risk for diabetes. More often and at a younger age if you are overweight or have a high risk for diabetes. What should I know about preventing infection? Hepatitis B If you have a higher risk for hepatitis B, you should be screened for this virus. Talk with your health care provider to find out if you are at risk for hepatitis B infection. Hepatitis C  Blood testing is recommended for: Everyone born from [birth year] through 1963. Anyone with known risk factors for hepatitis C. Sexually transmitted infections (STIs) You should be screened each year for STIs, including gonorrhea and chlamydia, if: You are sexually active and are younger than 49 years of age. You are older than 49 years of age and your health care provider tells you  that you are at risk for this type of infection. Your sexual activity has changed since you were last screened, and you are at increased risk for chlamydia or gonorrhea. Ask your health care provider if you are at risk. Ask your health care provider about whether you are at high risk for HIV. Your health care provider may recommend a prescription medicine to help prevent HIV infection. If you choose to take medicine to prevent HIV, you should first get tested for HIV. You should then be tested every 3 months for as long as you are taking the medicine. Follow these instructions at home: Alcohol use Do not drink alcohol if your health care provider tells you not to drink. If you drink alcohol: Limit how much you have to 0-2 drinks a day. Know how much alcohol is in your drink. In the [place] one drink equals one 12 oz bottle of beer (355 mL), one 5 oz glass of wine (148 mL), or one 1 oz glass of hard liquor (44 mL). Lifestyle Do not use any products that contain nicotine or tobacco. These products include cigarettes, chewing tobacco, and vaping devices, such as e-cigarettes. If you need help quitting, ask your health care provider. Do not use street drugs. Do not share needles. Ask your health care provider for help if you need support or information about quitting drugs. General instructions Schedule regular health, dental, and eye exams. Stay current with your vaccines. Tell your health care provider if: You often feel depressed. You have ever been abused or do not feel safe at home. Summary Adopting a healthy lifestyle and getting preventive care are important in promoting health and wellness. Follow your health care provider's instructions about healthy diet, exercising, and getting tested or screened for diseases. Follow your health care provider's instructions on monitoring your cholesterol and blood pressure. This information is not intended to replace advice given to you by your health  care provider. Make sure you discuss any questions you have with your health care provider. Document Revised: 09/28/2020 Document Reviewed: 09/28/2020 Elsevier Patient Education  [place].

## 2022-01-26 NOTE — Assessment & Plan Note (Signed)
Stable period on celexa '10mg'$  daily - continue.

## 2022-01-26 NOTE — Assessment & Plan Note (Signed)
Chronic, stable based on A1c. Encouraged renewed efforts to follow diabetic diet. Continue ozempic '1mg'$  weekly.

## 2022-01-26 NOTE — Assessment & Plan Note (Signed)
Chronic, stable on current regimen - continue rosuvastatin '10mg'$  daily. The ASCVD Risk score (Arnett DK, et al., 2019) failed to calculate for the following reasons:   The valid total cholesterol range is 130 to 320 mg/dL   Unable to determine if patient is Non-Hispanic African American

## 2022-01-31 ENCOUNTER — Ambulatory Visit: Payer: BC Managed Care – PPO | Admitting: Family Medicine

## 2022-01-31 ENCOUNTER — Encounter: Payer: Self-pay | Admitting: Family Medicine

## 2022-01-31 VITALS — BP 116/84 | HR 89 | Temp 98.4°F | Ht 69.25 in | Wt 232.2 lb

## 2022-01-31 DIAGNOSIS — M7501 Adhesive capsulitis of right shoulder: Secondary | ICD-10-CM

## 2022-01-31 DIAGNOSIS — E118 Type 2 diabetes mellitus with unspecified complications: Secondary | ICD-10-CM | POA: Diagnosis not present

## 2022-01-31 MED ORDER — TRIAMCINOLONE ACETONIDE 40 MG/ML IJ SUSP
40.0000 mg | Freq: Once | INTRAMUSCULAR | Status: AC
  Administered 2022-01-31: 40 mg via INTRA_ARTICULAR

## 2022-01-31 NOTE — Progress Notes (Signed)
    Catherin Doorn T. Jden Want, MD, [place] at [facility] [place], [address]  Phone: [phone]  FAX: [phone]  Michael Mcconnell - 51 y.o. male  MRN [id number]  Date of Birth: 1971/01/30  Date: 01/31/2022  PCP: Ria Bush, MD  Referral: Ria Bush, MD  Chief Complaint  Patient presents with   Shoulder Pain    Right   Subjective:   Michael Mcconnell is a 51 y.o. very pleasant male patient with Body mass index is 34.05 kg/m. who presents with the following:  R sided shoulder pain:  R shoulder, but it feels like radiates down his R arm. No injury.   Lab Results  Component Value Date   HGBA1C 6.9 (H) 01/19/2022    4-5 months of pain.     Review of Systems is noted in the HPI, as appropriate  Objective:   BP 116/84   Pulse 89   Temp 98.4 F (36.9 C) (Oral)   Ht 5' 9.25" (1.759 m)   Wt 232 lb 4 oz (105.3 kg)   SpO2 95%   BMI 34.05 kg/m   GEN: No acute distress; alert,appropriate. PULM: Breathing comfortably in no respiratory distress PSYCH: Normally interactive.   Laboratory and Imaging Data:  Assessment and Plan:   ***

## 2022-03-28 ENCOUNTER — Ambulatory Visit: Payer: BC Managed Care – PPO | Admitting: Family Medicine

## 2022-03-29 ENCOUNTER — Ambulatory Visit: Payer: BC Managed Care – PPO | Admitting: Family Medicine

## 2022-03-30 ENCOUNTER — Ambulatory Visit: Payer: BC Managed Care – PPO | Admitting: Family Medicine

## 2022-03-30 ENCOUNTER — Encounter: Payer: Self-pay | Admitting: Family Medicine

## 2022-03-30 VITALS — BP 114/80 | HR 84 | Temp 99.0°F | Ht 69.25 in | Wt 227.5 lb

## 2022-03-30 DIAGNOSIS — M7501 Adhesive capsulitis of right shoulder: Secondary | ICD-10-CM | POA: Diagnosis not present

## 2022-03-30 NOTE — Progress Notes (Signed)
Michael Mcconnell T. Michael Parsley, MD, [facility] [name], [address]  Phone: [phone]  FAX: [phone]  MD SMOLA - 51 y.o. male  MRN [id number]  Date of Birth: August 04, 1970  Date: 03/30/2022  PCP: Ria Bush, MD  Referral: Ria Bush, MD  Chief Complaint  Patient presents with   Follow-up    Right Shoulder   Subjective:   Michael Mcconnell is a 51 y.o. very pleasant male patient with Body mass index is 33.35 kg/m. who presents with the following:  Pleasant gentleman presents in follow-up for right-sided he is a capsulitis.  I saw him originally on January 31, 2022.  That point he had a fairly dramatic loss of range of motion in all directions.  Post shoulder injection, postanesthesia he was able to achieve 120 degrees in flexion and abduction, it was notably worse prior to anesthesia.  He is here today in follow-up.  About 90% improved.  His motion slowly got better and better after the last time I saw him.  He felt a lot of pain relief after injection, and he has slowly had return of motion.  He still has a lack of internal range of motion, but everything else is progressing nicely.  Review of Systems is noted in the HPI, as appropriate  Objective:   BP 114/80   Pulse 84   Temp 99 F (37.2 C) (Oral)   Ht 5' 9.25" (1.759 m)   Wt 227 lb 8 oz (103.2 kg)   SpO2 95%   BMI 33.35 kg/m   GEN: No acute distress; alert,appropriate. PULM: Breathing comfortably in no respiratory distress PSYCH: Normally interactive.    Shoulder: R and L Inspection: No muscle wasting or winging Ecchymosis/edema: neg  AC joint, scapula, clavicle: NT Cervical spine: NT, full ROM Spurling's: neg ABNORMAL SIDE TESTED: r UNLESS OTHERWISE NOTED, THE CONTRALATERAL SIDE HAS FULL RANGE OF MOTION. Abduction: 5/5, LIMITED TO 170 DEGREES Flexion: 5/5, LIMITED TO 170 DEGNO ROM  IR, lift-off: 5/5. TESTED AT 90  DEGREES OF ABDUCTION, LIMITED TO 0 DEGREES ER at neutral:  5/5, TESTED AT 90 DEGREES OF ABDUCTION, LIMITED TO 70 DEGREES AC crossover and compression: PAIN Drop Test: neg Empty Can: neg Supraspinatus insertion: NT Bicipital groove: NT ALL OTHER SPECIAL TESTING EQUIVOCAL GIVEN LOSS OF MOTION C5-T1 intact Sensation intact Grip 5/5    Laboratory and Imaging Data:  Assessment and Plan:     ICD-10-CM   1. Adhesive capsulitis of right shoulder  M75.01      He really has done very well from a frozen shoulder standpoint.  He has had dramatically improved range of motion in a fairly short time.  I think that is internal range of motion is lacking, to be expected, this will also slowly progress.  As his motion of becomes more fully normal, his pain will also improve.  Disposition: As needed  [employer] One speech-to-text software was used for transcription in this dictation.  Possible transcriptional errors can occur using Editor, commissioning.   Signed,  [name]. Jhanae Jaskowiak, MD   Outpatient Encounter Medications as of 03/30/2022  Medication Sig   Blood Glucose Monitoring Suppl (ONE TOUCH ULTRA MINI) w/Device KIT Use as directed   citalopram (CELEXA) 10 MG tablet Take 1 tablet (10 mg total) by mouth daily.   glucose blood (ONE TOUCH ULTRA TEST) test strip Once daily and as needed   losartan-hydrochlorothiazide (HYZAAR) 50-12.5 MG tablet Take 1 tablet  by mouth daily.   Multiple Vitamins-Minerals (MULTIVITAMIN PO) Take 1 capsule by mouth daily.   OneTouch Delica Lancets 36R MISC 1 EACH BY DOES NOT APPLY ROUTE AS DIRECTED. USE AS INSTRUCTED TO CHECK BLOOD SUGAR ONCE DAILY   psyllium (METAMUCIL) 0.52 g capsule Take 1 capsule (0.52 g total) by mouth in the morning and at bedtime.   rosuvastatin (CRESTOR) 10 MG tablet Take 1 tablet (10 mg total) by mouth daily.   Semaglutide, 1 MG/DOSE, 4 MG/3ML SOPN Inject 1 mg as directed once a week.   sildenafil (VIAGRA) 50 MG tablet Take 1 tablet (50 mg  total) daily as needed by mouth for erectile dysfunction.   No facility-administered encounter medications on file as of 03/30/2022.

## 2022-04-20 DIAGNOSIS — H2511 Age-related nuclear cataract, right eye: Secondary | ICD-10-CM | POA: Diagnosis not present

## 2022-07-12 DIAGNOSIS — H25013 Cortical age-related cataract, bilateral: Secondary | ICD-10-CM | POA: Diagnosis not present

## 2022-07-12 DIAGNOSIS — H2512 Age-related nuclear cataract, left eye: Secondary | ICD-10-CM | POA: Diagnosis not present

## 2022-07-12 DIAGNOSIS — H18413 Arcus senilis, bilateral: Secondary | ICD-10-CM | POA: Diagnosis not present

## 2022-07-12 DIAGNOSIS — H2513 Age-related nuclear cataract, bilateral: Secondary | ICD-10-CM | POA: Diagnosis not present

## 2022-07-12 DIAGNOSIS — H25043 Posterior subcapsular polar age-related cataract, bilateral: Secondary | ICD-10-CM | POA: Diagnosis not present

## 2022-07-27 ENCOUNTER — Encounter: Payer: Self-pay | Admitting: Family Medicine

## 2022-07-27 ENCOUNTER — Ambulatory Visit: Payer: BC Managed Care – PPO | Admitting: Family Medicine

## 2022-07-27 VITALS — BP 118/78 | HR 82 | Temp 97.3°F | Ht 69.25 in | Wt 232.4 lb

## 2022-07-27 DIAGNOSIS — G4733 Obstructive sleep apnea (adult) (pediatric): Secondary | ICD-10-CM

## 2022-07-27 DIAGNOSIS — E1136 Type 2 diabetes mellitus with diabetic cataract: Secondary | ICD-10-CM

## 2022-07-27 DIAGNOSIS — E1169 Type 2 diabetes mellitus with other specified complication: Secondary | ICD-10-CM | POA: Diagnosis not present

## 2022-07-27 DIAGNOSIS — I10 Essential (primary) hypertension: Secondary | ICD-10-CM

## 2022-07-27 DIAGNOSIS — G8929 Other chronic pain: Secondary | ICD-10-CM

## 2022-07-27 DIAGNOSIS — E118 Type 2 diabetes mellitus with unspecified complications: Secondary | ICD-10-CM

## 2022-07-27 DIAGNOSIS — M25511 Pain in right shoulder: Secondary | ICD-10-CM

## 2022-07-27 LAB — POCT GLYCOSYLATED HEMOGLOBIN (HGB A1C): Hemoglobin A1C: 7.1 % — AB (ref 4.0–5.6)

## 2022-07-27 NOTE — Assessment & Plan Note (Addendum)
R>L, followed by eye doctor.

## 2022-07-27 NOTE — Assessment & Plan Note (Signed)
Chronic, great control to date.

## 2022-07-27 NOTE — Assessment & Plan Note (Signed)
Continue nightly CPAP.  Decided against Dawna Part

## 2022-07-27 NOTE — Assessment & Plan Note (Signed)
Seen by sports medicine for adhesive capsulitis s/p steroid injection with resolution.

## 2022-07-27 NOTE — Progress Notes (Signed)
Patient ID: Michael Mcconnell, male    DOB: Dec 11, 1970, 52 y.o.   MRN: [id number]  This visit was conducted in person.  BP 118/78   Pulse 82   Temp (!) 97.3 F (36.3 C) (Temporal)   Ht 5' 9.25" (1.759 m)   Wt 232 lb 6 oz (105.4 kg)   SpO2 95%   BMI 34.07 kg/m    CC: 6 mo DM f/u visit Subjective:   HPI: Michael Mcconnell is a 52 y.o. male presenting on 07/27/2022 for Medical Management of Chronic Issues (Here for 6 mo DM f/u.)   Frozen shoulder improved with steroid shot.   OSA - on CPAP. Sees pulm Elsworth Soho), has seen ENT Wilburn Cornelia) for [facility] evaluation - rec against this. Tolerating mask well.   DM - does regularly check sugars fasting - 130s. Compliant with antihyperglycemic regimen which includes: ozempic '1mg'$  weekly, tolerating well. Metformin intolerance. Does like bread and rice. Activity limitd - planning to restart treadmill use/weights. Denies low sugars or hypoglycemic symptoms. Denies paresthesias, blurry vision. Last diabetic eye exam 09/2021. Glucometer brand: one touch delica. Last foot exam: 07/2021 - DUE. DSME: 2019 at [facility]. Found to have R>L cataracts, monitoring more closely.  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 Interim medical history since our last visit reviewed. Allergies and medications reviewed and updated. Outpatient Medications Prior to Visit  Medication Sig Dispense Refill   Blood Glucose  Monitoring Suppl (ONE TOUCH ULTRA MINI) w/Device KIT Use as directed 1 each 0   citalopram (CELEXA) 10 MG tablet Take 1 tablet (10 mg total) by mouth daily. 90 tablet 3   glucose blood (ONE TOUCH ULTRA TEST) test strip Once daily and as needed 100 each 3   losartan-hydrochlorothiazide (HYZAAR) 50-12.5 MG tablet Take 1 tablet by mouth daily. 90 tablet 3   Multiple Vitamins-Minerals (MULTIVITAMIN PO) Take 1 capsule by mouth daily.     OneTouch Delica Lancets 99991111 MISC 1 EACH BY DOES NOT APPLY ROUTE AS DIRECTED. USE AS INSTRUCTED TO CHECK BLOOD SUGAR ONCE DAILY 100 each 11   psyllium (METAMUCIL) 0.52 g capsule Take 1 capsule (0.52 g total) by mouth in the morning and at bedtime.     rosuvastatin (CRESTOR) 10 MG tablet Take 1 tablet (10 mg total) by mouth daily. 90 tablet 3   Semaglutide, 1 MG/DOSE, 4 MG/3ML SOPN Inject 1 mg as directed once a week. 9 mL 3   sildenafil (VIAGRA) 50 MG tablet Take 1 tablet (50 mg total) daily as needed by mouth for erectile dysfunction. 10 tablet 1   No facility-administered medications prior to visit.     Per HPI unless specifically indicated in ROS section below Review of Systems  Objective:  BP 118/78   Pulse 82   Temp (!) 97.3 F (36.3 C) (Temporal)   Ht 5' 9.25" (1.759 m)   Wt 232 lb 6 oz (105.4 kg)  SpO2 95%   BMI 34.07 kg/m   Wt Readings from Last 3 Encounters:  07/27/22 232 lb 6 oz (105.4 kg)  03/30/22 227 lb 8 oz (103.2 kg)  01/31/22 232 lb 4 oz (105.3 kg)      Physical Exam Vitals and nursing note reviewed.  Constitutional:      Appearance: Normal appearance. He is not ill-appearing.  Eyes:     Extraocular Movements: Extraocular movements intact.     Conjunctiva/sclera: Conjunctivae normal.     Pupils: Pupils are equal, round, and reactive to light.  Cardiovascular:     Rate and Rhythm: Normal rate and regular rhythm.     Pulses: Normal pulses.     Heart sounds: Normal heart sounds. No murmur heard. Pulmonary:     Effort: Pulmonary  effort is normal. No respiratory distress.     Breath sounds: Normal breath sounds. No wheezing, rhonchi or rales.  Musculoskeletal:     Right lower leg: No edema.     Left lower leg: No edema.     Comments: See HPI for foot exam if done  Skin:    General: Skin is warm and dry.     Findings: No rash.  Neurological:     Mental Status: He is alert.  Psychiatric:        Mood and Affect: Mood normal.        Behavior: Behavior normal.       Results for orders placed or performed in visit on 07/27/22  POCT glycosylated hemoglobin (Hb A1C)  Result Value Ref Range   Hemoglobin A1C 7.1 (A) 4.0 - 5.6 %   HbA1c POC (<> result, manual entry)     HbA1c, POC (prediabetic range)     HbA1c, POC (controlled diabetic range)      Assessment & Plan:   Problem List Items Addressed This Visit     Type 2 diabetes mellitus with other specified complication ([place]) - Primary    Chronic, overall stable with latest A1c 7.1% It seems metamucil has more sugar than benefiber - rec switch brands. Reviewed healthy diet choices  Encouraged starting regular exercise routine.  No med changes.  Reassess  control at CPE.       Relevant Orders   POCT glycosylated hemoglobin (Hb A1C) (Completed)   Hypertension    Chronic, great control to date.       Diabetes mellitus with cataract ([place])    R>L, followed by eye doctor.       OSA (obstructive sleep apnea)    Continue nightly CPAP.  Decided against Inspire      Right shoulder pain    Seen by sports medicine for adhesive capsulitis s/p steroid injection with resolution.         No orders of the defined types were placed in this encounter.   Orders Placed This Encounter  Procedures   POCT glycosylated hemoglobin (Hb A1C)    Patient Instructions  Switch from metamucil to benefiber.  You are doing well today  Work on incorporating exercise into routine over the next 6 months. Good to see you today Return as needed or in 6 months for  physical.   Follow up plan: Return in about 6 months (around 01/27/2023) for annual exam, prior fasting for blood work.  Ria Bush, MD

## 2022-07-27 NOTE — Assessment & Plan Note (Addendum)
Chronic, overall stable with latest A1c 7.1% It seems metamucil has more sugar than benefiber - rec switch brands. Reviewed healthy diet choices  Encouraged starting regular exercise routine.  No med changes.  Reassess  control at CPE.

## 2022-07-27 NOTE — Patient Instructions (Addendum)
Switch from metamucil to benefiber.  You are doing well today  Work on incorporating exercise into routine over the next 6 months. Good to see you today Return as needed or in 6 months for physical.

## 2022-08-28 ENCOUNTER — Other Ambulatory Visit: Payer: Self-pay | Admitting: Family Medicine

## 2022-10-29 DIAGNOSIS — G4733 Obstructive sleep apnea (adult) (pediatric): Secondary | ICD-10-CM | POA: Diagnosis not present

## 2022-11-03 LAB — HM DIABETES EYE EXAM

## 2022-11-28 DIAGNOSIS — G4733 Obstructive sleep apnea (adult) (pediatric): Secondary | ICD-10-CM | POA: Diagnosis not present

## 2022-12-29 DIAGNOSIS — G4733 Obstructive sleep apnea (adult) (pediatric): Secondary | ICD-10-CM | POA: Diagnosis not present

## 2023-01-05 ENCOUNTER — Encounter (INDEPENDENT_AMBULATORY_CARE_PROVIDER_SITE_OTHER): Payer: Self-pay

## 2023-01-10 ENCOUNTER — Ambulatory Visit: Payer: BC Managed Care – PPO | Admitting: Nurse Practitioner

## 2023-01-10 ENCOUNTER — Ambulatory Visit: Payer: BC Managed Care – PPO | Admitting: Family Medicine

## 2023-01-10 ENCOUNTER — Encounter: Payer: Self-pay | Admitting: Nurse Practitioner

## 2023-01-10 VITALS — BP 120/90 | HR 70 | Temp 97.6°F | Ht 69.25 in | Wt 232.2 lb

## 2023-01-10 DIAGNOSIS — L729 Follicular cyst of the skin and subcutaneous tissue, unspecified: Secondary | ICD-10-CM

## 2023-01-10 DIAGNOSIS — Z23 Encounter for immunization: Secondary | ICD-10-CM

## 2023-01-10 NOTE — Patient Instructions (Signed)
Nice to see you today I have referred you to dermatology  We gae you the first shingles vaccine today Make a nurse visit for 3 months to get your second one

## 2023-01-10 NOTE — Progress Notes (Signed)
   Acute Office Visit  Subjective:     Patient ID: Michael Mcconnell, male    DOB: May 19, 1971, 52 y.o.   MRN: [id number]  Chief Complaint  Patient presents with   Mass    Pt complains of cyst on lower left leg. Started 3 weeks ago. Pain when touched. No tingling or itchiness.     HPI Patient is in today for mass on left leg with a history of HTN OSA, NAFLD, DM2, CKD,   States that he noticed Sunday which makes 3 weeks. No changes per patient repoert. States that it is getting harder. No injury to the area. States that it is painful to touch. States that it was warm and hurt when it first started but has subsided. States that he did take ibuprofen in the beginning that did not help   Review of Systems  Constitutional:  Negative for chills and fever.  Respiratory:  Negative for shortness of breath.   Cardiovascular:  Negative for chest pain.  Neurological:  Negative for tingling and headaches.        Objective:    BP (!) 120/90   Pulse 70   Temp 97.6 F (36.4 C) (Temporal)   Ht 5' 9.25" (1.759 m)   Wt 232 lb 3.2 oz (105.3 kg)   SpO2 95%   BMI 34.04 kg/m  BP Readings from Last 3 Encounters:  01/10/23 (!) 120/90  07/27/22 118/78  03/30/22 114/80   Wt Readings from Last 3 Encounters:  01/10/23 232 lb 3.2 oz (105.3 kg)  07/27/22 232 lb 6 oz (105.4 kg)  03/30/22 227 lb 8 oz (103.2 kg)   SpO2 Readings from Last 3 Encounters:  01/10/23 95%  07/27/22 95%  03/30/22 95%      Physical Exam Vitals and nursing note reviewed.  Constitutional:      Appearance: Normal appearance.  Cardiovascular:     Rate and Rhythm: Normal rate and regular rhythm.     Heart sounds: Normal heart sounds.  Pulmonary:     Effort: Pulmonary effort is normal.     Breath sounds: Normal breath sounds.  Skin:      Neurological:     Mental Status: He is alert.     No results found for any visits on 01/10/23.      Assessment & Plan:   Problem List Items Addressed This Visit        Musculoskeletal and Integument   Skin cyst - Primary    Cyst of the skin.  Not currently infected did review signs and symptoms of infection wound with office know.  Amatory referral to dermatology for further evaluation and removal.      Relevant Orders   Ambulatory referral to Dermatology   Other Visit Diagnoses     Need for shingles vaccine       Relevant Orders   Zoster, Recombinant (Shingrix) (Completed)       No orders of the defined types were placed in this encounter.   Return if symptoms worsen or fail to improve, for As scheduled for CPE.  Audria Nine, NP

## 2023-01-10 NOTE — Assessment & Plan Note (Signed)
Cyst of the skin.  Not currently infected did review signs and symptoms of infection wound with office know.  Amatory referral to dermatology for further evaluation and removal.

## 2023-01-19 ENCOUNTER — Other Ambulatory Visit: Payer: Self-pay | Admitting: Family Medicine

## 2023-01-19 DIAGNOSIS — N183 Chronic kidney disease, stage 3 unspecified: Secondary | ICD-10-CM

## 2023-01-19 DIAGNOSIS — E1169 Type 2 diabetes mellitus with other specified complication: Secondary | ICD-10-CM

## 2023-01-19 DIAGNOSIS — Z125 Encounter for screening for malignant neoplasm of prostate: Secondary | ICD-10-CM

## 2023-01-24 ENCOUNTER — Other Ambulatory Visit: Payer: BC Managed Care – PPO

## 2023-01-24 ENCOUNTER — Encounter: Payer: Self-pay | Admitting: Internal Medicine

## 2023-01-31 ENCOUNTER — Encounter: Payer: Self-pay | Admitting: Family Medicine

## 2023-01-31 ENCOUNTER — Other Ambulatory Visit: Payer: Self-pay | Admitting: Family Medicine

## 2023-01-31 ENCOUNTER — Ambulatory Visit (INDEPENDENT_AMBULATORY_CARE_PROVIDER_SITE_OTHER): Payer: BC Managed Care – PPO | Admitting: Family Medicine

## 2023-01-31 VITALS — BP 124/78 | HR 82 | Temp 97.6°F | Ht 69.5 in | Wt 227.4 lb

## 2023-01-31 DIAGNOSIS — E1122 Type 2 diabetes mellitus with diabetic chronic kidney disease: Secondary | ICD-10-CM | POA: Diagnosis not present

## 2023-01-31 DIAGNOSIS — E1169 Type 2 diabetes mellitus with other specified complication: Secondary | ICD-10-CM | POA: Diagnosis not present

## 2023-01-31 DIAGNOSIS — Z125 Encounter for screening for malignant neoplasm of prostate: Secondary | ICD-10-CM

## 2023-01-31 DIAGNOSIS — E66811 Obesity, class 1: Secondary | ICD-10-CM

## 2023-01-31 DIAGNOSIS — I10 Essential (primary) hypertension: Secondary | ICD-10-CM

## 2023-01-31 DIAGNOSIS — Z23 Encounter for immunization: Secondary | ICD-10-CM | POA: Diagnosis not present

## 2023-01-31 DIAGNOSIS — E785 Hyperlipidemia, unspecified: Secondary | ICD-10-CM

## 2023-01-31 DIAGNOSIS — R4589 Other symptoms and signs involving emotional state: Secondary | ICD-10-CM | POA: Diagnosis not present

## 2023-01-31 DIAGNOSIS — L729 Follicular cyst of the skin and subcutaneous tissue, unspecified: Secondary | ICD-10-CM

## 2023-01-31 DIAGNOSIS — E669 Obesity, unspecified: Secondary | ICD-10-CM

## 2023-01-31 DIAGNOSIS — Z0000 Encounter for general adult medical examination without abnormal findings: Secondary | ICD-10-CM

## 2023-01-31 DIAGNOSIS — N183 Chronic kidney disease, stage 3 unspecified: Secondary | ICD-10-CM | POA: Diagnosis not present

## 2023-01-31 DIAGNOSIS — K760 Fatty (change of) liver, not elsewhere classified: Secondary | ICD-10-CM

## 2023-01-31 DIAGNOSIS — G4733 Obstructive sleep apnea (adult) (pediatric): Secondary | ICD-10-CM

## 2023-01-31 LAB — CBC WITH DIFFERENTIAL/PLATELET
Basophils Absolute: 0 10*3/uL (ref 0.0–0.1)
Basophils Relative: 0.7 % (ref 0.0–3.0)
Eosinophils Absolute: 0.1 10*3/uL (ref 0.0–0.7)
Eosinophils Relative: 1.1 % (ref 0.0–5.0)
HCT: 51.8 % (ref 39.0–52.0)
Hemoglobin: 16.7 g/dL (ref 13.0–17.0)
Lymphocytes Relative: 27.4 % (ref 12.0–46.0)
Lymphs Abs: 1.7 10*3/uL (ref 0.7–4.0)
MCHC: 32.3 g/dL (ref 30.0–36.0)
MCV: 82.1 fl (ref 78.0–100.0)
Monocytes Absolute: 0.4 10*3/uL (ref 0.1–1.0)
Monocytes Relative: 5.8 % (ref 3.0–12.0)
Neutro Abs: 4.1 10*3/uL (ref 1.4–7.7)
Neutrophils Relative %: 65 % (ref 43.0–77.0)
Platelets: 173 10*3/uL (ref 150.0–400.0)
RBC: 6.31 Mil/uL — ABNORMAL HIGH (ref 4.22–5.81)
RDW: 15.2 % (ref 11.5–15.5)
WBC: 6.4 10*3/uL (ref 4.0–10.5)

## 2023-01-31 LAB — LIPID PANEL
Cholesterol: 111 mg/dL (ref 0–200)
HDL: 38.3 mg/dL — ABNORMAL LOW (ref >39.00)
LDL Cholesterol: 47 mg/dL (ref 0–99)
NonHDL: 72.48
Total CHOL/HDL Ratio: 3
Triglycerides: 125 mg/dL (ref 0.0–149.0)
VLDL: 25 mg/dL (ref 0.0–40.0)

## 2023-01-31 LAB — COMPREHENSIVE METABOLIC PANEL
ALT: 35 U/L (ref 0–53)
AST: 29 U/L (ref 0–37)
Albumin: 4.3 g/dL (ref 3.5–5.2)
Alkaline Phosphatase: 74 U/L (ref 39–117)
BUN: 14 mg/dL (ref 6–23)
CO2: 31 meq/L (ref 19–32)
Calcium: 9.2 mg/dL (ref 8.4–10.5)
Chloride: 104 meq/L (ref 96–112)
Creatinine, Ser: 1.44 mg/dL (ref 0.40–1.50)
GFR: 56.76 mL/min — ABNORMAL LOW (ref >60.00)
Glucose, Bld: 127 mg/dL — ABNORMAL HIGH (ref 70–99)
Potassium: 4.2 meq/L (ref 3.5–5.1)
Sodium: 142 meq/L (ref 135–145)
Total Bilirubin: 1.4 mg/dL — ABNORMAL HIGH (ref 0.2–1.2)
Total Protein: 7.1 g/dL (ref 6.0–8.3)

## 2023-01-31 LAB — MICROALBUMIN / CREATININE URINE RATIO
Creatinine,U: 194.7 mg/dL
Microalb Creat Ratio: 1.5 mg/g (ref 0.0–30.0)
Microalb, Ur: 3 mg/dL — ABNORMAL HIGH (ref 0.0–1.9)

## 2023-01-31 LAB — PSA: PSA: 1.3 ng/mL (ref 0.10–4.00)

## 2023-01-31 LAB — HEMOGLOBIN A1C: Hgb A1c MFr Bld: 7 % — ABNORMAL HIGH (ref 4.6–6.5)

## 2023-01-31 LAB — VITAMIN D 25 HYDROXY (VIT D DEFICIENCY, FRACTURES): VITD: 17.25 ng/mL — ABNORMAL LOW (ref 30.00–100.00)

## 2023-01-31 LAB — PHOSPHORUS: Phosphorus: 2.9 mg/dL (ref 2.3–4.6)

## 2023-01-31 MED ORDER — ROSUVASTATIN CALCIUM 10 MG PO TABS
10.0000 mg | ORAL_TABLET | Freq: Every day | ORAL | 4 refills | Status: DC
Start: 2023-01-31 — End: 2024-02-12

## 2023-01-31 MED ORDER — ESCITALOPRAM OXALATE 20 MG PO TABS: 20.0000 mg | ORAL_TABLET | Freq: Every day | ORAL | 4 refills | Status: DC

## 2023-01-31 MED ORDER — SEMAGLUTIDE (1 MG/DOSE) 4 MG/3ML ~~LOC~~ SOPN
1.0000 mg | PEN_INJECTOR | SUBCUTANEOUS | 4 refills | Status: DC
Start: 2023-01-31 — End: 2023-11-14

## 2023-01-31 MED ORDER — LOSARTAN POTASSIUM-HCTZ 50-12.5 MG PO TABS
1.0000 | ORAL_TABLET | Freq: Every day | ORAL | 4 refills | Status: DC
Start: 2023-01-31 — End: 2024-02-12

## 2023-01-31 NOTE — Patient Instructions (Addendum)
Flu shot today  Change celexa to lexapro 20mg  daily. Update [place] with effect.  Labs today  Return as needed or in 6 months for diabetes follow up visit

## 2023-01-31 NOTE — Assessment & Plan Note (Signed)
Update FLP on crestor 10mg  daily. The ASCVD Risk score (Arnett DK, et al., 2019) failed to calculate for the following reasons:   The valid total cholesterol range is 130 to 320 mg/dL   Unable to determine if patient is Non-Hispanic African American

## 2023-01-31 NOTE — Assessment & Plan Note (Signed)
Encourage healthy diet and lifestyle choices to affect sustainable weight loss.  No regular physical activity at this time.

## 2023-01-31 NOTE — Assessment & Plan Note (Addendum)
Chronic, deteriorated in setting of life stressors. Will change celexa to lexapro, increase to 20mg  daily. Update with effect.

## 2023-01-31 NOTE — Assessment & Plan Note (Signed)
Presumed cyst, await derm eval

## 2023-01-31 NOTE — Progress Notes (Signed)
Ph: [phone] Fax: [phone]   Patient ID: Michael Mcconnell, male    DOB: [birth year], 52 y.o.   MRN: [id number]  This visit was conducted in person.  BP 124/78   Pulse 82   Temp 97.6 F (36.4 C) (Temporal)   Ht 5' 9.5" (1.765 m)   Wt 227 lb 6 oz (103.1 kg)   SpO2 98%   BMI 33.10 kg/m    CC: CPE Subjective:   HPI: Michael Mcconnell is a 52 y.o. male presenting on 01/31/2023 for Annual Exam   Lump developed to left lower leg about 1.5 months ago. Denies inciting trauma/injury or fall. Initially warm red and tender to touch, has since become harder area, remains uncomfortable to touch. Pending derm eval.   OSA - on CPAP. Sees pulm Vassie Loll), has seen ENT Annalee Genta) for [facility] evaluation - rec against this. Tolerating nasal pillow mask well.   DM - continues ozempic 1mg  weekly.   Mood - notes worsening mood difficulty - more irritable, easily agitated. Denies SI/HI.   Preventative: COLONOSCOPY 02/2020 - 4 TAs, small hem, rpt 3 yrs [name] - rpt this year Prostate cancer screening - no fmhx. Nocturia x1. No weakening of stream.  Flu shot yearly  COVID vaccine - Pfizer 07/2019, 08/2019, booster 04/2020, bivalent 03/2021 Pneumovax 2017  Td 2009, Tdap 10/2017  Shingrix - 12/2022, rpt pending Seatbelt use discussed  Sunscreen use discussed. No changing moles on skin.  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 Interim medical history since our last visit reviewed. Allergies and medications reviewed  and updated. Outpatient Medications Prior to Visit  Medication Sig Dispense Refill   Blood Glucose Monitoring Suppl (ONE TOUCH ULTRA MINI) w/Device KIT Use as directed 1 each 0   glucose blood (ONE TOUCH ULTRA TEST) test strip Once daily and as needed 100 each 3   Multiple Vitamins-Minerals (MULTIVITAMIN PO) Take 1 capsule by mouth daily.     OneTouch Delica Lancets 30G MISC 1 EACH BY DOES NOT APPLY ROUTE AS DIRECTED. USE AS INSTRUCTED TO CHECK BLOOD SUGAR ONCE DAILY 100 each 11   psyllium (METAMUCIL) 0.52 g capsule Take 1 capsule (0.52 g total) by mouth in the morning and at bedtime.     sildenafil (VIAGRA) 50 MG tablet Take 1 tablet (50 mg total) daily as needed by mouth for erectile dysfunction. 10 tablet 1   citalopram (CELEXA) 10 MG tablet Take 1 tablet (10 mg total) by mouth daily. 90 tablet 3   losartan-hydrochlorothiazide (HYZAAR) 50-12.5 MG tablet TAKE 1 TABLET BY MOUTH EVERY DAY 90 tablet 1   rosuvastatin (CRESTOR) 10 MG tablet Take 1 tablet (10 mg total) by mouth daily. 90 tablet 3   Semaglutide, 1 MG/DOSE, 4 MG/3ML SOPN Inject 1 mg as directed once a week. 9 mL 3   No facility-administered medications prior to visit.     Per HPI unless specifically indicated in ROS section below Review of Systems  Constitutional:  Positive for fever (low grade fever after shingles shot). Negative for activity  change, appetite change, chills, fatigue and unexpected weight change.  HENT:  Negative for hearing loss.   Eyes:  Negative for visual disturbance.  Respiratory:  Negative for cough, chest tightness, shortness of breath and wheezing.   Cardiovascular:  Negative for chest pain, palpitations and leg swelling.  Gastrointestinal:  Negative for abdominal distention, abdominal pain, blood in stool, constipation, diarrhea, nausea and vomiting.  Genitourinary:  Negative for difficulty urinating and hematuria.  Musculoskeletal:  Negative for arthralgias, myalgias and neck pain.  Skin:  Negative for  rash.  Neurological:  Negative for dizziness, seizures, syncope and headaches.  Hematological:  Negative for adenopathy. Does not bruise/bleed easily.  Psychiatric/Behavioral:  Negative for dysphoric mood. The patient is not nervous/anxious.     Objective:  BP 124/78   Pulse 82   Temp 97.6 F (36.4 C) (Temporal)   Ht 5' 9.5" (1.765 m)   Wt 227 lb 6 oz (103.1 kg)   SpO2 98%   BMI 33.10 kg/m   Wt Readings from Last 3 Encounters:  01/31/23 227 lb 6 oz (103.1 kg)  01/10/23 232 lb 3.2 oz (105.3 kg)  07/27/22 232 lb 6 oz (105.4 kg)      Physical Exam Vitals and nursing note reviewed.  Constitutional:      General: He is not in acute distress.    Appearance: Normal appearance. He is well-developed. He is not ill-appearing.  HENT:     Head: Normocephalic and atraumatic.     Right Ear: Hearing, tympanic membrane, ear canal and external ear normal.     Left Ear: Hearing, tympanic membrane, ear canal and external ear normal.     Nose: Nose normal.     Mouth/Throat:     Mouth: Mucous membranes are moist.     Pharynx: Oropharynx is clear. No oropharyngeal exudate or posterior oropharyngeal erythema.  Eyes:     General: No scleral icterus.    Extraocular Movements: Extraocular movements intact.     Conjunctiva/sclera: Conjunctivae normal.     Pupils: Pupils are equal, round, and reactive to light.  Neck:     Thyroid: No thyroid mass or thyromegaly.  Cardiovascular:     Rate and Rhythm: Normal rate and regular rhythm.     Pulses: Normal pulses.          Radial pulses are 2+ on the right side and 2+ on the left side.     Heart sounds: Normal heart sounds. No murmur heard. Pulmonary:     Effort: Pulmonary effort is normal. No respiratory distress.     Breath sounds: Normal breath sounds. No wheezing, rhonchi or rales.  Abdominal:     General: Bowel sounds are normal. There is no distension.     Palpations: Abdomen is soft. There is no mass.     Tenderness: There is no abdominal  tenderness. There is no guarding or rebound.     Hernia: No hernia is present.  Musculoskeletal:        General: Normal range of motion.     Cervical back: Normal range of motion and neck supple.     Right lower leg: No edema.     Left lower leg: No edema.  Lymphadenopathy:     Cervical: No cervical adenopathy.  Skin:    General: Skin is warm and dry.     Findings: No rash.          Comments: Tender lump to left medial lower leg without erythema or warmth ~1cm diameter  Neurological:  General: No focal deficit present.     Mental Status: He is alert and oriented to person, place, and time.  Psychiatric:        Mood and Affect: Mood normal.        Behavior: Behavior normal.        Thought Content: Thought content normal.        Judgment: Judgment normal.       Results for orders placed or performed in visit on 07/27/22  POCT glycosylated hemoglobin (Hb A1C)  Result Value Ref Range   Hemoglobin A1C 7.1 (A) 4.0 - 5.6 %   HbA1c POC (<> result, manual entry)     HbA1c, POC (prediabetic range)     HbA1c, POC (controlled diabetic range)        01/31/2023    8:40 AM 01/10/2023   12:04 PM 07/27/2022    8:54 AM 01/26/2022    9:51 AM 01/22/2021    4:00 PM  Depression screen PHQ 2/9  Decreased Interest 2 1 1 2 1   Down, Depressed, Hopeless 2 0 1 1 1   PHQ - 2 Score 4 1 2 3 2   Altered sleeping 1 1 1 1  0  Tired, decreased energy 2 1 1 1 1   Change in appetite 1 2 1 1 1   Feeling bad or failure about yourself  1 0 1 1 1   Trouble concentrating 2 1 1 1  0  Moving slowly or fidgety/restless 1 0 0 0 0  Suicidal thoughts 1 0 1 0 0  PHQ-9 Score 13 6 8 8 5   Difficult doing work/chores Very difficult Somewhat difficult Not difficult at all Somewhat difficult        01/31/2023    8:41 AM 01/10/2023   12:04 PM 07/27/2022    8:54 AM 01/26/2022    9:51 AM  GAD 7 : Generalized Anxiety Score  Nervous, Anxious, on Edge 2 1 1 1   Control/stop worrying 2 1 1 1   Worry too much - different things 2 1 1  1   Trouble relaxing 2 1 1 1   Restless 2 1 1  0  Easily annoyed or irritable 3 1 1    Afraid - awful might happen 2 1 1 1   Total GAD 7 Score 15 7 7    Anxiety Difficulty  Not difficult at all Somewhat difficult Somewhat difficult   Assessment & Plan:   Problem List Items Addressed This Visit     Health maintenance examination - Primary (Chronic)    Preventative protocols reviewed and updated unless pt declined. Discussed healthy diet and lifestyle.       Type 2 diabetes mellitus with other specified complication (HCC)    Chronic, stable on ozempic - continue this. Update A1c.       Relevant Medications   losartan-hydrochlorothiazide (HYZAAR) 50-12.5 MG tablet   rosuvastatin (CRESTOR) 10 MG tablet   Semaglutide, 1 MG/DOSE, 4 MG/3ML SOPN   Hypertension    Chronic, stable on current regimen - continue.       Relevant Medications   losartan-hydrochlorothiazide (HYZAAR) 50-12.5 MG tablet   rosuvastatin (CRESTOR) 10 MG tablet   Dyslipidemia associated with type 2 diabetes mellitus (HCC)    Update FLP on crestor 10mg  daily. The ASCVD Risk score (Arnett DK, et al., 2019) failed to calculate for the following reasons:   The valid total cholesterol range is 130 to 320 mg/dL   Unable to determine if patient is Non-Hispanic African American       Relevant Medications  losartan-hydrochlorothiazide (HYZAAR) 50-12.5 MG tablet   rosuvastatin (CRESTOR) 10 MG tablet   Semaglutide, 1 MG/DOSE, 4 MG/3ML SOPN   Obesity, Class I, BMI 30-34.9    Encourage healthy diet and lifestyle choices to affect sustainable weight loss.  No regular physical activity at this time.       NAFLD (nonalcoholic fatty liver disease)   OSA (obstructive sleep apnea)    Continues nightly CPAP.       Depressed mood    Chronic, deteriorated in setting of life stressors. Will change celexa to lexapro, increase to 20mg  daily. Update with effect.       CKD stage 3 due to type 2 diabetes mellitus (HCC)    Update  renal panel including Umicroalbumin.       Relevant Medications   losartan-hydrochlorothiazide (HYZAAR) 50-12.5 MG tablet   rosuvastatin (CRESTOR) 10 MG tablet   Semaglutide, 1 MG/DOSE, 4 MG/3ML SOPN   Skin cyst    Presumed cyst, await derm eval      Other Visit Diagnoses     Encounter for immunization       Relevant Orders   Flu vaccine trivalent PF, 6mos and older(Flulaval,Afluria,Fluarix,Fluzone) (Completed)   Special screening for malignant neoplasm of prostate            Meds ordered this encounter  Medications   losartan-hydrochlorothiazide (HYZAAR) 50-12.5 MG tablet    Sig: Take 1 tablet by mouth daily.    Dispense:  90 tablet    Refill:  4   rosuvastatin (CRESTOR) 10 MG tablet    Sig: Take 1 tablet (10 mg total) by mouth daily.    Dispense:  90 tablet    Refill:  4   Semaglutide, 1 MG/DOSE, 4 MG/3ML SOPN    Sig: Inject 1 mg as directed once a week.    Dispense:  9 mL    Refill:  4   escitalopram (LEXAPRO) 20 MG tablet    Sig: Take 1 tablet (20 mg total) by mouth daily.    Dispense:  90 tablet    Refill:  4    To replace celexa    Orders Placed This Encounter  Procedures   Flu vaccine trivalent PF, 6mos and older(Flulaval,Afluria,Fluarix,Fluzone)    Patient Instructions  Flu shot today  Change celexa to lexapro 20mg  daily. Update [place] with effect.  Labs today  Return as needed or in 6 months for diabetes follow up visit   Follow up plan: Return in about 6 months (around 07/31/2023) for follow up visit.  Eustaquio Boyden, MD

## 2023-01-31 NOTE — Assessment & Plan Note (Signed)
Continues nightly CPAP.  

## 2023-01-31 NOTE — Assessment & Plan Note (Signed)
Preventative protocols reviewed and updated unless pt declined. Discussed healthy diet and lifestyle.  

## 2023-01-31 NOTE — Assessment & Plan Note (Addendum)
Chronic, stable on ozempic - continue this. Update A1c.

## 2023-01-31 NOTE — Assessment & Plan Note (Signed)
Chronic, stable on current regimen - continue. 

## 2023-01-31 NOTE — Assessment & Plan Note (Addendum)
Update renal panel including Umicroalbumin.

## 2023-02-01 LAB — PARATHYROID HORMONE, INTACT (NO CA): PTH: 47 pg/mL (ref 16–77)

## 2023-02-02 ENCOUNTER — Encounter: Payer: Self-pay | Admitting: Family Medicine

## 2023-02-02 ENCOUNTER — Other Ambulatory Visit: Payer: Self-pay | Admitting: Family Medicine

## 2023-02-02 DIAGNOSIS — E559 Vitamin D deficiency, unspecified: Secondary | ICD-10-CM | POA: Insufficient documentation

## 2023-02-02 MED ORDER — VITAMIN D3 1.25 MG (50000 UT) PO TABS: 1.0000 | ORAL_TABLET | ORAL | 1 refills | Status: DC

## 2023-02-07 ENCOUNTER — Encounter: Payer: Self-pay | Admitting: Internal Medicine

## 2023-02-21 HISTORY — PX: COLONOSCOPY: SHX174

## 2023-03-02 ENCOUNTER — Ambulatory Visit: Payer: BC Managed Care – PPO

## 2023-03-02 VITALS — Ht 70.0 in | Wt 230.0 lb

## 2023-03-02 DIAGNOSIS — Z860100 Personal history of colon polyps, unspecified: Secondary | ICD-10-CM

## 2023-03-02 MED ORDER — NA SULFATE-K SULFATE-MG SULF 17.5-3.13-1.6 GM/177ML PO SOLN
1.0000 | Freq: Once | ORAL | 0 refills | Status: AC
Start: 2023-03-02 — End: 2023-03-02

## 2023-03-02 NOTE — Progress Notes (Signed)

## 2023-03-09 DIAGNOSIS — L988 Other specified disorders of the skin and subcutaneous tissue: Secondary | ICD-10-CM | POA: Diagnosis not present

## 2023-03-09 DIAGNOSIS — L853 Xerosis cutis: Secondary | ICD-10-CM | POA: Diagnosis not present

## 2023-03-09 DIAGNOSIS — D485 Neoplasm of uncertain behavior of skin: Secondary | ICD-10-CM | POA: Diagnosis not present

## 2023-03-09 DIAGNOSIS — L308 Other specified dermatitis: Secondary | ICD-10-CM | POA: Diagnosis not present

## 2023-03-13 ENCOUNTER — Encounter: Payer: Self-pay | Admitting: Internal Medicine

## 2023-03-14 ENCOUNTER — Ambulatory Visit: Payer: BC Managed Care – PPO

## 2023-03-24 ENCOUNTER — Encounter: Payer: Self-pay | Admitting: Internal Medicine

## 2023-03-24 ENCOUNTER — Ambulatory Visit (AMBULATORY_SURGERY_CENTER): Payer: BC Managed Care – PPO | Admitting: Internal Medicine

## 2023-03-24 VITALS — BP 98/65 | HR 65 | Temp 98.0°F | Resp 11 | Ht 69.5 in | Wt 230.0 lb

## 2023-03-24 DIAGNOSIS — Z09 Encounter for follow-up examination after completed treatment for conditions other than malignant neoplasm: Secondary | ICD-10-CM

## 2023-03-24 DIAGNOSIS — Z860100 Personal history of colon polyps, unspecified: Secondary | ICD-10-CM

## 2023-03-24 DIAGNOSIS — Z1211 Encounter for screening for malignant neoplasm of colon: Secondary | ICD-10-CM | POA: Diagnosis not present

## 2023-03-24 DIAGNOSIS — Z860101 Personal history of adenomatous and serrated colon polyps: Secondary | ICD-10-CM | POA: Diagnosis not present

## 2023-03-24 MED ORDER — SODIUM CHLORIDE 0.9 % IV SOLN: 500.0000 mL | INTRAVENOUS | Status: DC

## 2023-03-24 NOTE — Progress Notes (Signed)
Vital signs prior to 1130 not associated with this patient

## 2023-03-24 NOTE — Progress Notes (Signed)
Sedate, gd SR, tolerated procedure well, VSS, report to RN 

## 2023-03-24 NOTE — Progress Notes (Signed)
HISTORY OF PRESENT ILLNESS:  Michael Mcconnell is a 52 y.o. male with a history of multiple adenomatous colon polyps.  Presents today for surveillance colonoscopy.  No complaints  REVIEW OF SYSTEMS:  All non-GI ROS negative except for  Past Medical History:  Diagnosis Date   Cyst of left kidney 09/16/2015   On [place] 2010 - stable on rpt [place] 2017. Consider dedicated MRI to further characterize.    Diabetes type 2, controlled (HCC)    HLD (hyperlipidemia)    HTN (hypertension)    NAFLD (nonalcoholic fatty liver disease) 1610, 2017   abd [place], viral hep panel neg   Sleep apnea 2012   CPAP 4 hours/night - prior saw [facility] physicians    Past Surgical History:  Procedure Laterality Date   abd [place]  06/2008   echogenic, ?fatty infiltration, ?L renal focus interpolar region   COLONOSCOPY  02/2020   4 TAs, small hem, rpt 3 yrs [name]   HEMORRHOID BANDING  2016   x3 ([place])    Social History Michael Mcconnell  reports that he quit smoking about 16 years ago. His smoking use included cigarettes. He has never used smokeless tobacco. He reports current alcohol use. He reports that he does not use drugs.  family history includes Alcohol abuse in his father; Coronary artery disease (age of onset: 19) in his father; Diabetes in his maternal grandfather and paternal grandmother; Hyperlipidemia in his father; Hypertension in his father and mother; Stroke in his maternal aunt, maternal grandfather, and paternal grandmother.  Allergies  Allergen Reactions   Aspirin Nausea Only   Lisinopril Other (See Comments)    HA       PHYSICAL EXAMINATION: Vital signs: BP 122/74   Pulse 66   Temp 98 F (36.7 C)   Resp (!) 6   Ht 5' 9.5" (1.765 m)   Wt 230 lb (104.3 kg)   SpO2 100%   BMI 33.48 kg/m  General: Well-developed, well-nourished, no acute distress HEENT: Sclerae are anicteric, conjunctiva pink. Oral mucosa intact Lungs: Clear Heart: Regular Abdomen: soft, nontender, nondistended, no obvious  ascites, no peritoneal signs, normal bowel sounds. No organomegaly. Extremities: No edema Psychiatric: alert and oriented x3. Cooperative     ASSESSMENT:  History of multiple adenomatous colon polyps   PLAN:  Surveillance colonoscopy

## 2023-03-24 NOTE — Patient Instructions (Addendum)
Resume previous diet.  Continue present medications.     YOU HAD AN ENDOSCOPIC PROCEDURE TODAY AT THE [facility]:   Refer to the procedure report that was given to you for any specific questions about what was found during the examination.  If the procedure report does not answer your questions, please call your gastroenterologist to clarify.  If you requested that your care partner not be given the details of your procedure findings, then the procedure report has been included in a sealed envelope for you to review at your convenience later.  YOU SHOULD EXPECT: Some feelings of bloating in the abdomen. Passage of more gas than usual.  Walking can help get rid of the air that was put into your GI tract during the procedure and reduce the bloating. If you had a lower endoscopy (such as a colonoscopy or flexible sigmoidoscopy) you may notice spotting of blood in your stool or on the toilet paper. If you underwent a bowel prep for your procedure, you may not have a normal bowel movement for a few days.  Please Note:  You might notice some irritation and congestion in your nose or some drainage.  This is from the oxygen used during your procedure.  There is no need for concern and it should clear up in a day or so.  SYMPTOMS TO REPORT IMMEDIATELY:  Following lower endoscopy (colonoscopy or flexible sigmoidoscopy):  Excessive amounts of blood in the stool  Significant tenderness or worsening of abdominal pains  Swelling of the abdomen that is new, acute  Fever of 100F or higher   For urgent or emergent issues, a gastroenterologist can be reached at any hour by calling [phone]. Do not use MyChart messaging for urgent concerns.    DIET:  We do recommend a small meal at first, but then you may proceed to your regular diet.  Drink plenty of fluids but you should avoid alcoholic beverages for 24 hours.  ACTIVITY:  You should plan to take it easy for the rest of today and you  should NOT DRIVE or use heavy machinery until tomorrow (because of the sedation medicines used during the test).    FOLLOW UP: Our staff will call the number listed on your records the next business day following your procedure.  We will call around 7:15- 8:00 am to check on you and address any questions or concerns that you may have regarding the information given to you following your procedure. If we do not reach you, we will leave a message.     If any biopsies were taken you will be contacted by phone or by letter within the next 1-3 weeks.  Please call us at [phone] if you have not heard about the biopsies in 3 weeks.    SIGNATURES/CONFIDENTIALITY: You and/or your care partner have signed paperwork which will be entered into your electronic medical record.  These signatures attest to the fact that that the information above on your After Visit Summary has been reviewed and is understood.  Full responsibility of the confidentiality of this discharge information lies with you and/or your care-partner.

## 2023-03-24 NOTE — Op Note (Signed)
[facility] Patient Name: Michael Mcconnell Procedure Date: 03/24/2023 11:35 AM MRN: [id number] Endoscopist: Wilhemina Bonito. Marina Goodell , MD, [id number] Age: 52 Referring MD:  Date of Birth: 1970/06/29 Gender: Male Account #: [id number] Procedure:                Colonoscopy Indications:              High risk colon cancer surveillance: Personal                            history of multiple (3 or more) adenomas. Previous                            examination 2021 Medicines:                Monitored Anesthesia Care Procedure:                Pre-Anesthesia Assessment:                           - Prior to the procedure, a History and Physical                            was performed, and patient medications and                            allergies were reviewed. The patient's tolerance of                            previous anesthesia was also reviewed. The risks                            and benefits of the procedure and the sedation                            options and risks were discussed with the patient.                            All questions were answered, and informed consent                            was obtained. Prior Anticoagulants: The patient has                            taken no anticoagulant or antiplatelet agents. ASA                            Grade Assessment: II - A patient with mild systemic                            disease. After reviewing the risks and benefits,                            the patient was deemed in satisfactory condition to  undergo the procedure.                           After obtaining informed consent, the colonoscope                            was passed under direct vision. Throughout the                            procedure, the patient's blood pressure, pulse, and                            oxygen saturations were monitored continuously. The                            Olympus Scope SN: [id number] was introduced  through                            the anus and advanced to the the cecum, identified                            by appendiceal orifice and ileocecal valve. The                            ileocecal valve, appendiceal orifice, and rectum                            were photographed. The quality of the bowel                            preparation was excellent. The colonoscopy was                            performed without difficulty. The patient tolerated                            the procedure well. The bowel preparation used was                            SUPREP via split dose instruction. Scope In: 11:41:24 AM Scope Out: 11:50:39 AM Scope Withdrawal Time: 0 hours 7 minutes 21 seconds  Total Procedure Duration: 0 hours 9 minutes 15 seconds  Findings:                 Internal hemorrhoids were found during retroflexion.                           The exam was otherwise without abnormality on                            direct and retroflexion views. Complications:            No immediate complications. Estimated blood loss:  None. Estimated Blood Loss:     Estimated blood loss: none. Impression:               - Internal hemorrhoids.                           - The examination was otherwise normal on direct                            and retroflexion views.                           - No specimens collected. Recommendation:           - Repeat colonoscopy in 5 years for surveillance.                           - Patient has a contact number available for                            emergencies. The signs and symptoms of potential                            delayed complications were discussed with the                            patient. Return to normal activities tomorrow.                            Written discharge instructions were provided to the                            patient.                           - Resume previous diet.                           -  Continue present medications. Wilhemina Bonito. Marina Goodell, MD 03/24/2023 11:56:46 AM This report has been signed electronically.

## 2023-03-24 NOTE — Progress Notes (Signed)
Pt's states no medical or surgical changes since previsit or office visit. 

## 2023-03-27 ENCOUNTER — Telehealth: Payer: Self-pay

## 2023-03-27 NOTE — Telephone Encounter (Signed)
  Follow up Call-     03/24/2023   11:03 AM  Call back number  Post procedure Call Back phone  # [phone]  Permission to leave phone message Yes     Patient questions:  Do you have a fever, pain , or abdominal swelling? No. Pain Score  0 *  Have you tolerated food without any problems? Yes.    Have you been able to return to your normal activities? Yes.    Do you have any questions about your discharge instructions: Diet   No. Medications  No. Follow up visit  No.  Do you have questions or concerns about your Care? No.  Actions: * If pain score is 4 or above: No action needed, pain <4.

## 2023-03-28 ENCOUNTER — Telehealth: Payer: Self-pay | Admitting: Internal Medicine

## 2023-03-28 NOTE — Telephone Encounter (Signed)
Left message for pt to call back. Pt had no polyps removed. He does however have internal Hemorrhoids.

## 2023-03-28 NOTE — Telephone Encounter (Signed)
Inbound call from patient stating he has been having excessive bleeding since colonoscopy on 11/1. Patient is requesting a call to be advised. Please advise, thank you.

## 2023-03-30 NOTE — Telephone Encounter (Signed)
Left message for pt to call back  °

## 2023-03-31 NOTE — Telephone Encounter (Signed)
Left message for pt to call back again. Have been unable to reach patient after multiple attempts. Will await further communication from pt.

## 2023-07-25 ENCOUNTER — Other Ambulatory Visit: Payer: Self-pay | Admitting: Family Medicine

## 2023-07-25 DIAGNOSIS — E559 Vitamin D deficiency, unspecified: Secondary | ICD-10-CM

## 2023-07-25 NOTE — Telephone Encounter (Signed)
 Vit D3 Last filled:  04/26/23, #12 Last OV:  01/31/23, CPE Next OV:  07/31/23, 6 mo DM f/u

## 2023-07-31 ENCOUNTER — Ambulatory Visit: Payer: BC Managed Care – PPO | Admitting: Family Medicine

## 2023-07-31 DIAGNOSIS — E1169 Type 2 diabetes mellitus with other specified complication: Secondary | ICD-10-CM

## 2023-10-06 ENCOUNTER — Ambulatory Visit
Admission: RE | Admit: 2023-10-06 | Discharge: 2023-10-06 | Disposition: A | Discharge status: Home / Self Care | Source: Ambulatory Visit | Attending: Emergency Medicine | Admitting: Emergency Medicine

## 2023-10-06 ENCOUNTER — Other Ambulatory Visit: Payer: Self-pay

## 2023-10-06 VITALS — BP 117/79 | HR 85 | Temp 98.2°F | Resp 16

## 2023-10-06 DIAGNOSIS — L237 Allergic contact dermatitis due to plants, except food: Secondary | ICD-10-CM | POA: Diagnosis not present

## 2023-10-06 DIAGNOSIS — L03114 Cellulitis of left upper limb: Secondary | ICD-10-CM | POA: Diagnosis not present

## 2023-10-06 MED ORDER — SULFAMETHOXAZOLE-TRIMETHOPRIM 800-160 MG PO TABS
2.0000 | ORAL_TABLET | Freq: Two times a day (BID) | ORAL | 0 refills | Status: AC
Start: 2023-10-06 — End: 2023-10-16

## 2023-10-06 MED ORDER — CEFTRIAXONE SODIUM 1 G IJ SOLR
2.0000 g | Freq: Once | INTRAMUSCULAR | Status: AC
  Administered 2023-10-06: 2 g via INTRAMUSCULAR

## 2023-10-06 MED ORDER — TRIAMCINOLONE ACETONIDE 40 MG/ML IJ SUSP
80.0000 mg | Freq: Once | INTRAMUSCULAR | Status: AC
  Administered 2023-10-06: 80 mg via INTRAMUSCULAR

## 2023-10-06 MED ORDER — TRIAMCINOLONE ACETONIDE 0.1 % EX OINT: 1.0000 | TOPICAL_OINTMENT | Freq: Two times a day (BID) | CUTANEOUS | 0 refills | Status: DC

## 2023-10-06 NOTE — ED Provider Notes (Signed)
 [facility]    CSN: [id number] Arrival date & time: 10/06/23  1454    HISTORY   Chief Complaint  Patient presents with   Allergic Reaction    It appears to be poison ivy or poison oak on my left arm, ear, and on my head. Itches real bad and over the counter medicine is not helping. - Entered by patient   Rash   HPI Michael Mcconnell is a pleasant, 53 y.o. male who presents to urgent care today. Patient complains of redness, pain, itching on anterior aspect of left forearm, and left ear and has also noticed an itchy lesion on the top of his head.  Patient states he has not been outside in his yard but he has small dogs that play in his yard and he knows that there is poison ivy and poison oak back there.  Patient states the area of redness on his left forearm has almost doubled in size since this morning.  Patient denies fever, chills, body aches, rash otherwise.     Allergic Reaction Presenting symptoms: rash and swelling   Severity:  Moderate Duration:  3 days Prior allergic episodes:  No prior episodes Relieved by:  None tried Ineffective treatments:  None tried Rash  Past Medical History:  Diagnosis Date   Cyst of left kidney 09/16/2015   On US  2010 - stable on rpt US  2017. Consider dedicated MRI to further characterize.    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   Depressed mood 06/08/2019   OSA (obstructive sleep apnea) 04/08/2019   Diabetes mellitus with cataract (HCC) 06/02/2018   Erectile dysfunction 04/07/2017   Pre-syncope  01/18/2016   Left-sided low back pain with sciatica 09/16/2015   NAFLD (nonalcoholic fatty liver disease) 65/78/4696   Cyst of left kidney 09/16/2015   Obesity, Class I, BMI 30-34.9 06/10/2014   Internal hemorrhoid 01/20/2012   Hypertension 06/27/2011   Dyslipidemia associated with type 2 diabetes mellitus (HCC) 06/27/2011   Type 2 diabetes mellitus with other specified complication (HCC) 06/16/2011   Health maintenance examination 03/28/2011   Past Surgical History:  Procedure Laterality Date   abd us   06/2008   echogenic, ?fatty infiltration, ?L renal focus interpolar region   COLONOSCOPY  02/2020   4 TAs, small hem, rpt 3 yrs Elvin Hammer)   HEMORRHOID BANDING  2016   x3 ([place])    Home Medications    Prior to Admission medications   Medication Sig Start Date End Date Taking? Authorizing Provider  sulfamethoxazole-trimethoprim (BACTRIM DS) 800-160 MG tablet Take 2 tablets by mouth 2 (two) times daily for 10 days. 10/06/23 10/16/23 Yes Eloise Hake Scales, PA-C  triamcinolone  ointment (KENALOG ) 0.1 % Apply 1 Application topically 2 (two) times daily. Apply to affected area(s) twice daily until itching has resolved. 10/06/23  Yes Eloise Hake Scales, PA-C  Blood Glucose Monitoring Suppl (ONE TOUCH ULTRA MINI) w/Device KIT Use as directed 07/07/17   Claire Crick, MD  Cholecalciferol (VITAMIN D3) 1.25 MG (50000  UT) CAPS TAKE 1 TABLET BY MOUTH ONE TIME PER WEEK 07/27/23   Claire Crick, MD  escitalopram  (LEXAPRO ) 20 MG tablet Take 1 tablet (20 mg total) by mouth daily. 01/31/23   Claire Crick, MD  glucose blood (ONE TOUCH ULTRA TEST) test strip Once daily and as needed 07/23/21   Claire Crick, MD  losartan -hydrochlorothiazide  (HYZAAR) 50-12.5 MG tablet Take 1 tablet by mouth daily. 01/31/23   Claire Crick, MD  Multiple Vitamins-Minerals (MULTIVITAMIN PO) Take 1 capsule by mouth daily.    [provider]  OneTouch Delica Lancets 30G MISC 1 EACH BY DOES NOT APPLY  ROUTE AS DIRECTED. USE AS INSTRUCTED TO CHECK BLOOD SUGAR ONCE DAILY 09/02/19   Claire Crick, MD  psyllium (METAMUCIL) 0.52 g capsule Take 1 capsule (0.52 g total) by mouth in the morning and at bedtime. 07/23/21   Claire Crick, MD  rosuvastatin  (CRESTOR ) 10 MG tablet Take 1 tablet (10 mg total) by mouth daily. 01/31/23   Claire Crick, MD  Semaglutide , 1 MG/DOSE, 4 MG/3ML SOPN Inject 1 mg as directed once a week. 01/31/23   Claire Crick, MD  sildenafil  (VIAGRA ) 50 MG tablet Take 1 tablet (50 mg total) daily as needed by mouth for erectile dysfunction. 04/07/17   Claire Crick, MD    Family History Family History  Problem Relation Age of Onset   Coronary artery disease Father 28       MI x2   Hypertension Father    Hyperlipidemia Father    Alcohol abuse Father    Hypertension Mother    Stroke Maternal Aunt    Diabetes Maternal Grandfather    Stroke Maternal Grandfather    Diabetes Paternal Grandmother    Stroke Paternal Grandmother    Cancer Neg Hx    Colon polyps Neg Hx    Colon cancer Neg Hx    Esophageal cancer Neg Hx    Stomach cancer Neg Hx    Rectal cancer Neg Hx    Social History Social History   Tobacco Use   Smoking status: Former    Current packs/day: 0.00    Types: Cigarettes    Quit date: 05/23/2006    Years since quitting: 17.3   Smokeless tobacco: Never   Tobacco comments:    Socially  Vaping Use   Vaping status: Some Days  Substance Use Topics   Alcohol use: Yes    Comment: occ   Drug use: No   Allergies   Aspirin and Lisinopril   Review of Systems Review of Systems  Skin:  Positive for rash.   Pertinent findings revealed after performing a 14 point review of systems has been noted in the history of present illness.  Physical Exam Vital Signs BP 117/79 (BP Location: Right Arm)   Pulse 85   Temp 98.2 F (36.8 C) (Oral)   Resp 16   SpO2 94%   No data found.  Physical Exam Vitals and nursing note reviewed.   Constitutional:      General: He is not in acute distress.    Appearance: Normal appearance. He is normal weight. He is not ill-appearing.  HENT:     Head: Normocephalic and atraumatic.  Eyes:     Extraocular Movements: Extraocular movements intact.     Conjunctiva/sclera: Conjunctivae normal.     Pupils: Pupils are equal, round, and reactive to light.  Cardiovascular:     Rate and Rhythm: Normal rate and regular rhythm.  Pulmonary:     Effort: Pulmonary effort is normal.  Breath sounds: Normal breath sounds.  Musculoskeletal:        General: Normal range of motion.       Arms:     Cervical back: Normal range of motion and neck supple.  Skin:    General: Skin is warm and dry.  Neurological:     General: No focal deficit present.     Mental Status: He is alert and oriented to person, place, and time. Mental status is at baseline.  Psychiatric:        Mood and Affect: Mood normal.        Behavior: Behavior normal.        Thought Content: Thought content normal.        Judgment: Judgment normal.     Visual Acuity Right Eye Distance:   Left Eye Distance:   Bilateral Distance:    Right Eye Near:   Left Eye Near:    Bilateral Near:     UC Couse / Diagnostics / Procedures:     Radiology No results found.  Procedures Procedures (including critical care time) EKG  Pending results:  Labs Reviewed - No data to display  Medications Ordered in UC: Medications  cefTRIAXone (ROCEPHIN) injection 2 g (2 g Intramuscular Given 10/06/23 1528)  triamcinolone  acetonide (KENALOG -40) injection 80 mg (80 mg Intramuscular Given 10/06/23 1529)    UC Diagnoses / Final Clinical Impressions(s)   I have reviewed the triage vital signs and the nursing notes.  Pertinent labs & imaging results that were available during my care of the patient were reviewed by me and considered in my medical decision making (see chart for details).    Final diagnoses:  Allergic contact dermatitis  due to plants, except food  Cellulitis of left upper extremity   Suspect patient initially had contact with plant irritant and had some scratching.  Patient now exhibiting signs and symptoms of acute cellulitis.  Patient was provided with injection of 2 g of Rocephin during his visit today along with a prescription for Bactrim double strength 2 tabs twice daily x 10 days for treatment of cellulitis.  Skin marker was used to mark the area concerning for cellulitis.  Patient advised that if redness is not receded from this line or has exceeded this line in the next 12 hours, he should go to the emergency room for further evaluation.  Patient verbalized understanding.  Patient provided with an injection of triamcinolone  for treatment of the residual poison ivy along with topical triamcinolone  for treatment of any residual areas of itching and discomfort.  Patient advised to discontinue use of topical steroid once itching has resolved.  Please see discharge instructions below for details of plan of care as provided to patient. ED Prescriptions     Medication Sig Dispense Auth. Provider   sulfamethoxazole-trimethoprim (BACTRIM DS) 800-160 MG tablet Take 2 tablets by mouth 2 (two) times daily for 10 days. 40 tablet Eloise Hake Scales, PA-C   triamcinolone  ointment (KENALOG ) 0.1 % Apply 1 Application topically 2 (two) times daily. Apply to affected area(s) twice daily until itching has resolved. 80 g Eloise Hake Scales, PA-C      PDMP not reviewed this encounter.  Pending results:  Labs Reviewed - No data to display    Discharge Instructions      For treatment of cellulitis, which is an infection of the skin, you received an injection of an antibiotic called Rocephin which should provide you with almost immediate relief of the redness and swelling in  your arm.  It is very important that you continue antibiotic treatment with an oral antibiotic called Bactrim.  Please take 2 tablets twice  daily for the next 10 days.  For treatment of poison ivy, we provided you with an injection of a potent intramuscular steroid called triamcinolone  during your visit today.  For continued treatment of your itching, please apply triamcinolone  ointment twice daily until your itching resolves.  You do not need to continue applying triamcinolone  once the itching has completely resolved.  If you have not had resolution of your symptoms after 10 days of treatment, please return for repeat evaluation.  If you have worsening symptoms despite treatment, please go to the emergency room for further evaluation and treatment.  Thank you for visiting [facility] today.   Disposition Upon Discharge:  Condition: stable for discharge home  Patient presented with an acute illness with associated systemic symptoms and significant discomfort requiring urgent management. In my opinion, this is a condition that a prudent lay person (someone who possesses an average knowledge of health and medicine) may potentially expect to result in complications if not addressed urgently such as respiratory distress, impairment of bodily function or dysfunction of bodily organs.   Routine symptom specific, illness specific and/or disease specific instructions were discussed with the patient and/or caregiver at length.   As such, the patient has been evaluated and assessed, work-up was performed and treatment was provided in alignment with urgent care protocols and evidence based medicine.  Patient/parent/caregiver has been advised that the patient may require follow up for further testing and treatment if the symptoms continue in spite of treatment, as clinically indicated and appropriate.  Patient/parent/caregiver has been advised to return to the [facility] or PCP if no better; to PCP or the Emergency Department if new signs and symptoms develop, or if the current signs or symptoms continue to change or worsen for further  workup, evaluation and treatment as clinically indicated and appropriate  The patient will follow up with their current PCP if and as advised. If the patient does not currently have a PCP we will assist them in obtaining one.   The patient may need specialty follow up if the symptoms continue, in spite of conservative treatment and management, for further workup, evaluation, consultation and treatment as clinically indicated and appropriate.  Patient/parent/caregiver verbalized understanding and agreement of plan as discussed.  All questions were addressed during visit.  Please see discharge instructions below for further details of plan.  This office note has been dictated using Teaching laboratory technician.  Unfortunately, this method of dictation can sometimes lead to typographical or grammatical errors.  I apologize for your inconvenience in advance if this occurs.  Please do not hesitate to reach out to me if clarification is needed.      Eloise Hake Scales, PA-C 10/06/23 1737

## 2023-10-06 NOTE — ED Triage Notes (Signed)
 Patient C/O pain, itching, redness left forearm, top of head and left ear. Large amount of redness and warmth noted to LFA. Red streaking upwards to axilla. Patient denies fever and chills.

## 2023-10-06 NOTE — Discharge Instructions (Addendum)
 For treatment of cellulitis, which is an infection of the skin, you received an injection of an antibiotic called Rocephin which should provide you with almost immediate relief of the redness and swelling in your arm.  It is very important that you continue antibiotic treatment with an oral antibiotic called Bactrim.  Please take 2 tablets twice daily for the next 10 days.  For treatment of poison ivy, we provided you with an injection of a potent intramuscular steroid called triamcinolone  during your visit today.  For continued treatment of your itching, please apply triamcinolone  ointment twice daily until your itching resolves.  You do not need to continue applying triamcinolone  once the itching has completely resolved.  If you have not had resolution of your symptoms after 10 days of treatment, please return for repeat evaluation.  If you have worsening symptoms despite treatment, please go to the emergency room for further evaluation and treatment.  Thank you for visiting  Urgent Care today.

## 2023-10-11 ENCOUNTER — Encounter: Payer: Self-pay | Admitting: Family Medicine

## 2023-10-24 ENCOUNTER — Encounter (INDEPENDENT_AMBULATORY_CARE_PROVIDER_SITE_OTHER): Payer: Self-pay

## 2023-10-24 ENCOUNTER — Ambulatory Visit: Admitting: Family Medicine

## 2023-11-14 ENCOUNTER — Ambulatory Visit: Admitting: Family Medicine

## 2023-11-14 ENCOUNTER — Encounter: Payer: Self-pay | Admitting: Family Medicine

## 2023-11-14 VITALS — BP 142/92 | HR 80 | Temp 98.3°F | Ht 69.5 in | Wt 235.1 lb

## 2023-11-14 DIAGNOSIS — E559 Vitamin D deficiency, unspecified: Secondary | ICD-10-CM | POA: Diagnosis not present

## 2023-11-14 DIAGNOSIS — E1169 Type 2 diabetes mellitus with other specified complication: Secondary | ICD-10-CM

## 2023-11-14 DIAGNOSIS — I10 Essential (primary) hypertension: Secondary | ICD-10-CM

## 2023-11-14 LAB — POCT GLYCOSYLATED HEMOGLOBIN (HGB A1C): Hemoglobin A1C: 7.1 % — AB (ref 4.0–5.6)

## 2023-11-14 MED ORDER — SEMAGLUTIDE (2 MG/DOSE) 8 MG/3ML ~~LOC~~ SOPN: 2.0000 mg | PEN_INJECTOR | SUBCUTANEOUS | 0 refills | Status: DC

## 2023-11-14 MED ORDER — VITAMIN D3 1.25 MG (50000 UT) PO CAPS
1.0000 | ORAL_CAPSULE | ORAL | 0 refills | Status: DC
Start: 2023-11-14 — End: 2024-02-12

## 2023-11-14 NOTE — Assessment & Plan Note (Signed)
 Chronic, deteriorated control - forgot BP med yesterday.  BP improves on recheck but remaining above goal. Rec start monitoring BP at home and notify me if persistently >140/90.

## 2023-11-14 NOTE — Patient Instructions (Addendum)
 Increase ozempic  to 2mg  weekly - new dose at pharmacy.  Return in 3 months for physical, prior fasting for labs.  BP was elevated today - start monitoring at home - goal <140/90, ideally closer to 120/70s

## 2023-11-14 NOTE — Assessment & Plan Note (Signed)
 Chronic, stable period on ozempic , doesn't note as much effect on appetite /weight loss as desired. A1c remains above goal - will increase ozempic  to 2mg  weekly. If ineffective, consider switch to mounjaro.  Foot exam today Upcoming eye exam tomorrow.

## 2023-11-14 NOTE — Progress Notes (Signed)
 Ph: [phone] Fax: [phone]   Patient ID: Michael Mcconnell, male    DOB: [birth year], 53 y.o.   MRN: [id number]  This visit was conducted in person.  BP (!) 142/92 (BP Location: Right Arm, Cuff Size: Large)   Pulse 80   Temp 98.3 F (36.8 C) (Oral)   Ht 5' 9.5 (1.765 m)   Wt 235 lb 2 oz (106.7 kg)   SpO2 93%   BMI 34.22 kg/m   BP Readings from Last 3 Encounters:  11/14/23 (!) 142/92  10/06/23 117/79  03/24/23 98/65    CC: DM f/u visit  Subjective:   HPI: Michael Mcconnell is a 53 y.o. male presenting on 11/14/2023 for Medical Management of Chronic Issues (Here for DM f/u.)   DM - does regularly check sugars 80-90s fasting. Compliant with antihyperglycemic regimen which includes: (Ozempic ) semaglutide  1mg  weekly. Feels he's tolerating med well without epigastric pain, nausea, diarrhea, constipation. Denies low sugars or hypoglycemic symptoms. Denies paresthesias, blurry vision. Last diabetic eye exam 10/2022 - rpt scheduled for tomorrow. Glucometer brand: one touch. Last foot exam: 07/2022 - DUE. DSME: [facility]. Lab Results  Component Value Date   HGBA1C 7.1 (A) 11/14/2023   Diabetic Foot Exam - Simple   Simple Foot Form Diabetic Foot exam was performed with the following findings: Yes 11/14/2023  8:03 AM  Visual Inspection No deformities, no ulcerations, no other skin breakdown bilaterally: Yes Sensation Testing Intact to touch and monofilament testing bilaterally: Yes Pulse Check Posterior Tibialis and Dorsalis pulse intact bilaterally: Yes Comments No claudication    Lab Results  Component Value Date   MICROALBUR 3.0 (H) 01/31/2023    HTN - Compliant with current antihypertensive regimen of hyzaar 50/12.5mg  daily. Forgot meds yesterday. Does not check blood pressures at home.  No low blood pressure readings or symptoms of dizziness/syncope.  Denies HA, vision changes, CP/tightness, SOB, leg swelling.        Relevant past medical, surgical, family and  social history reviewed and updated as indicated. Interim medical history since our last visit reviewed. Allergies and medications reviewed and updated. Outpatient Medications Prior to Visit  Medication Sig Dispense Refill   Blood Glucose Monitoring Suppl (ONE TOUCH ULTRA MINI) w/Device KIT Use as directed 1 each 0   escitalopram  (LEXAPRO ) 20 MG tablet Take 1 tablet (20 mg total) by mouth daily. 90 tablet 4   glucose blood (ONE TOUCH ULTRA TEST) test strip Once daily and as needed 100 each 3   losartan -hydrochlorothiazide  (HYZAAR) 50-12.5 MG tablet Take 1 tablet by mouth daily. 90 tablet 4   Multiple Vitamins-Minerals (MULTIVITAMIN PO) Take 1 capsule by mouth daily.     OneTouch Delica Lancets 30G MISC 1 EACH BY DOES NOT APPLY ROUTE AS DIRECTED. USE AS INSTRUCTED TO CHECK BLOOD SUGAR ONCE DAILY 100 each 11   psyllium (METAMUCIL) 0.52 g capsule Take 1 capsule (0.52 g total) by mouth in the morning and at bedtime.     rosuvastatin  (CRESTOR ) 10 MG tablet Take 1 tablet (10 mg total) by mouth daily. 90 tablet 4   sildenafil  (VIAGRA ) 50 MG tablet Take 1 tablet (50 mg total) daily as needed by mouth for erectile dysfunction. 10 tablet 1   triamcinolone  ointment (KENALOG ) 0.1 % Apply 1 Application topically 2 (two) times daily. Apply to affected area(s) twice daily until itching has resolved. 80 g 0   Cholecalciferol (VITAMIN D3) 1.25 MG (50000 UT) CAPS TAKE 1 TABLET BY MOUTH ONE TIME PER WEEK 12 capsule  1   Semaglutide , 1 MG/DOSE, 4 MG/3ML SOPN Inject 1 mg as directed once a week. 9 mL 4   No facility-administered medications prior to visit.     Per HPI unless specifically indicated in ROS section below Review of Systems  Objective:  BP (!) 142/92 (BP Location: Right Arm, Cuff Size: Large)   Pulse 80   Temp 98.3 F (36.8 C) (Oral)   Ht 5' 9.5 (1.765 m)   Wt 235 lb 2 oz (106.7 kg)   SpO2 93%   BMI 34.22 kg/m   Wt Readings from Last 3 Encounters:  11/14/23 235 lb 2 oz (106.7 kg)  03/24/23  230 lb (104.3 kg)  03/02/23 230 lb (104.3 kg)      Physical Exam Vitals and nursing note reviewed.  Constitutional:      Appearance: Normal appearance. He is not ill-appearing.  HENT:     Head: Normocephalic and atraumatic.     Mouth/Throat:     Mouth: Mucous membranes are moist.     Pharynx: Oropharynx is clear. No oropharyngeal exudate or posterior oropharyngeal erythema.   Eyes:     Extraocular Movements: Extraocular movements intact.     Conjunctiva/sclera: Conjunctivae normal.     Pupils: Pupils are equal, round, and reactive to light.    Cardiovascular:     Rate and Rhythm: Normal rate and regular rhythm.     Pulses: Normal pulses.     Heart sounds: Normal heart sounds. No murmur heard. Pulmonary:     Effort: Pulmonary effort is normal. No respiratory distress.     Breath sounds: Normal breath sounds. No wheezing, rhonchi or rales.   Musculoskeletal:     Right lower leg: No edema.     Left lower leg: No edema.     Comments: See HPI for foot exam if done   Skin:    General: Skin is warm and dry.     Findings: No rash.   Neurological:     Mental Status: He is alert.   Psychiatric:        Mood and Affect: Mood normal.        Behavior: Behavior normal.       Results for orders placed or performed in visit on 11/14/23  POCT glycosylated hemoglobin (Hb A1C)   Collection Time: 11/14/23  7:57 AM  Result Value Ref Range   Hemoglobin A1C 7.1 (A) 4.0 - 5.6 %   HbA1c POC (<> result, manual entry)     HbA1c, POC (prediabetic range)     HbA1c, POC (controlled diabetic range)     Lab Results  Component Value Date   VD25OH 17.25 (L) 01/31/2023    Assessment & Plan:   Problem List Items Addressed This Visit     Type 2 diabetes mellitus with other specified complication (HCC) - Primary   Chronic, stable period on ozempic , doesn't note as much effect on appetite /weight loss as desired. A1c remains above goal - will increase ozempic  to 2mg  weekly. If ineffective,  consider switch to mounjaro.  Foot exam today Upcoming eye exam tomorrow.       Relevant Medications   Semaglutide , 2 MG/DOSE, 8 MG/3ML SOPN   Other Relevant Orders   POCT glycosylated hemoglobin (Hb A1C) (Completed)   Hypertension   Chronic, deteriorated control - forgot BP med yesterday.  BP improves on recheck but remaining above goal. Rec start monitoring BP at home and notify me if persistently >140/90.      Vitamin D   deficiency   Continue weekly vit D2 replacement, then will recheck levels at CPE      Relevant Medications   Cholecalciferol (VITAMIN D3) 1.25 MG (50000 UT) CAPS     Meds ordered this encounter  Medications   Semaglutide , 2 MG/DOSE, 8 MG/3ML SOPN    Sig: Inject 2 mg as directed once a week.    Dispense:  9 mL    Refill:  0    Note new dose   Cholecalciferol (VITAMIN D3) 1.25 MG (50000 UT) CAPS    Sig: Take 1 capsule (1.25 mg total) by mouth once a week.    Dispense:  12 capsule    Refill:  0    Orders Placed This Encounter  Procedures   POCT glycosylated hemoglobin (Hb A1C)    Patient Instructions  Increase ozempic  to 2mg  weekly - new dose at pharmacy.  Return in 3 months for physical, prior fasting for labs.  BP was elevated today - start monitoring at home - goal <140/90, ideally closer to 120/70s  Follow up plan: Return in about 3 months (around 02/14/2024) for annual exam, prior fasting for blood work.  Anton Blas, MD

## 2023-11-14 NOTE — Assessment & Plan Note (Signed)
 Continue weekly vit D2 replacement, then will recheck levels at CPE

## 2023-11-15 DIAGNOSIS — E119 Type 2 diabetes mellitus without complications: Secondary | ICD-10-CM | POA: Diagnosis not present

## 2023-11-15 DIAGNOSIS — H53143 Visual discomfort, bilateral: Secondary | ICD-10-CM | POA: Diagnosis not present

## 2024-02-04 ENCOUNTER — Other Ambulatory Visit: Payer: Self-pay | Admitting: Family Medicine

## 2024-02-04 DIAGNOSIS — Z125 Encounter for screening for malignant neoplasm of prostate: Secondary | ICD-10-CM

## 2024-02-04 DIAGNOSIS — E1122 Type 2 diabetes mellitus with diabetic chronic kidney disease: Secondary | ICD-10-CM

## 2024-02-04 DIAGNOSIS — E559 Vitamin D deficiency, unspecified: Secondary | ICD-10-CM

## 2024-02-04 DIAGNOSIS — E1169 Type 2 diabetes mellitus with other specified complication: Secondary | ICD-10-CM

## 2024-02-05 ENCOUNTER — Other Ambulatory Visit (INDEPENDENT_AMBULATORY_CARE_PROVIDER_SITE_OTHER)

## 2024-02-05 ENCOUNTER — Ambulatory Visit: Payer: Self-pay | Admitting: Family Medicine

## 2024-02-05 ENCOUNTER — Encounter: Payer: Self-pay | Admitting: Family Medicine

## 2024-02-05 DIAGNOSIS — E559 Vitamin D deficiency, unspecified: Secondary | ICD-10-CM | POA: Diagnosis not present

## 2024-02-05 DIAGNOSIS — N183 Chronic kidney disease, stage 3 unspecified: Secondary | ICD-10-CM

## 2024-02-05 DIAGNOSIS — E1169 Type 2 diabetes mellitus with other specified complication: Secondary | ICD-10-CM | POA: Diagnosis not present

## 2024-02-05 DIAGNOSIS — E1122 Type 2 diabetes mellitus with diabetic chronic kidney disease: Secondary | ICD-10-CM | POA: Diagnosis not present

## 2024-02-05 DIAGNOSIS — Z125 Encounter for screening for malignant neoplasm of prostate: Secondary | ICD-10-CM

## 2024-02-05 DIAGNOSIS — E785 Hyperlipidemia, unspecified: Secondary | ICD-10-CM | POA: Diagnosis not present

## 2024-02-05 LAB — CBC WITH DIFFERENTIAL/PLATELET
Basophils Absolute: 0 K/uL (ref 0.0–0.1)
Basophils Relative: 0.5 % (ref 0.0–3.0)
Eosinophils Absolute: 0.1 K/uL (ref 0.0–0.7)
Eosinophils Relative: 0.7 % (ref 0.0–5.0)
HCT: 51.4 % (ref 39.0–52.0)
Hemoglobin: 16.9 g/dL (ref 13.0–17.0)
Lymphocytes Relative: 24.8 % (ref 12.0–46.0)
Lymphs Abs: 2.1 K/uL (ref 0.7–4.0)
MCHC: 32.9 g/dL (ref 30.0–36.0)
MCV: 82.4 fl (ref 78.0–100.0)
Monocytes Absolute: 0.5 K/uL (ref 0.1–1.0)
Monocytes Relative: 6 % (ref 3.0–12.0)
Neutro Abs: 5.8 K/uL (ref 1.4–7.7)
Neutrophils Relative %: 68 % (ref 43.0–77.0)
Platelets: 160 K/uL (ref 150.0–400.0)
RBC: 6.24 Mil/uL — ABNORMAL HIGH (ref 4.22–5.81)
RDW: 14.2 % (ref 11.5–15.5)
WBC: 8.6 K/uL (ref 4.0–10.5)

## 2024-02-05 LAB — COMPREHENSIVE METABOLIC PANEL WITH GFR
ALT: 19 U/L (ref 0–53)
AST: 22 U/L (ref 0–37)
Albumin: 4.4 g/dL (ref 3.5–5.2)
Alkaline Phosphatase: 73 U/L (ref 39–117)
BUN: 16 mg/dL (ref 6–23)
CO2: 32 meq/L (ref 19–32)
Calcium: 9 mg/dL (ref 8.4–10.5)
Chloride: 103 meq/L (ref 96–112)
Creatinine, Ser: 1.4 mg/dL (ref 0.40–1.50)
GFR: 58.29 mL/min — ABNORMAL LOW (ref >60.00)
Glucose, Bld: 162 mg/dL — ABNORMAL HIGH (ref 70–99)
Potassium: 3.6 meq/L (ref 3.5–5.1)
Sodium: 142 meq/L (ref 135–145)
Total Bilirubin: 1.6 mg/dL — ABNORMAL HIGH (ref 0.2–1.2)
Total Protein: 6.8 g/dL (ref 6.0–8.3)

## 2024-02-05 LAB — LIPID PANEL
Cholesterol: 134 mg/dL (ref 0–200)
HDL: 40.6 mg/dL (ref >39.00)
LDL Cholesterol: 59 mg/dL (ref 0–99)
NonHDL: 92.93
Total CHOL/HDL Ratio: 3
Triglycerides: 169 mg/dL — ABNORMAL HIGH (ref 0.0–149.0)
VLDL: 33.8 mg/dL (ref 0.0–40.0)

## 2024-02-05 LAB — VITAMIN B12: Vitamin B-12: 233 pg/mL (ref 211–911)

## 2024-02-05 LAB — PHOSPHORUS: Phosphorus: 2.8 mg/dL (ref 2.3–4.6)

## 2024-02-05 LAB — MICROALBUMIN / CREATININE URINE RATIO
Creatinine,U: 354.1 mg/dL
Microalb Creat Ratio: 16.1 mg/g (ref 0.0–30.0)
Microalb, Ur: 5.7 mg/dL — ABNORMAL HIGH (ref 0.0–1.9)

## 2024-02-05 LAB — VITAMIN D 25 HYDROXY (VIT D DEFICIENCY, FRACTURES): VITD: 61.52 ng/mL (ref 30.00–100.00)

## 2024-02-05 LAB — PSA: PSA: 1.14 ng/mL (ref 0.10–4.00)

## 2024-02-05 LAB — HEMOGLOBIN A1C: Hgb A1c MFr Bld: 6.7 % — ABNORMAL HIGH (ref 4.6–6.5)

## 2024-02-06 LAB — PARATHYROID HORMONE, INTACT (NO CA): PTH: 14 pg/mL — ABNORMAL LOW (ref 16–77)

## 2024-02-12 ENCOUNTER — Ambulatory Visit: Admitting: Family Medicine

## 2024-02-12 ENCOUNTER — Encounter: Payer: Self-pay | Admitting: Family Medicine

## 2024-02-12 VITALS — BP 124/88 | HR 80 | Temp 99.4°F | Ht 69.5 in | Wt 223.0 lb

## 2024-02-12 DIAGNOSIS — Z23 Encounter for immunization: Secondary | ICD-10-CM

## 2024-02-12 DIAGNOSIS — E1122 Type 2 diabetes mellitus with diabetic chronic kidney disease: Secondary | ICD-10-CM

## 2024-02-12 DIAGNOSIS — E785 Hyperlipidemia, unspecified: Secondary | ICD-10-CM

## 2024-02-12 DIAGNOSIS — K760 Fatty (change of) liver, not elsewhere classified: Secondary | ICD-10-CM

## 2024-02-12 DIAGNOSIS — E559 Vitamin D deficiency, unspecified: Secondary | ICD-10-CM

## 2024-02-12 DIAGNOSIS — I10 Essential (primary) hypertension: Secondary | ICD-10-CM

## 2024-02-12 DIAGNOSIS — Z0000 Encounter for general adult medical examination without abnormal findings: Secondary | ICD-10-CM

## 2024-02-12 DIAGNOSIS — E66811 Obesity, class 1: Secondary | ICD-10-CM

## 2024-02-12 DIAGNOSIS — N183 Chronic kidney disease, stage 3 unspecified: Secondary | ICD-10-CM

## 2024-02-12 DIAGNOSIS — G4733 Obstructive sleep apnea (adult) (pediatric): Secondary | ICD-10-CM

## 2024-02-12 DIAGNOSIS — F4323 Adjustment disorder with mixed anxiety and depressed mood: Secondary | ICD-10-CM

## 2024-02-12 DIAGNOSIS — E1169 Type 2 diabetes mellitus with other specified complication: Secondary | ICD-10-CM

## 2024-02-12 DIAGNOSIS — Z7985 Long-term (current) use of injectable non-insulin antidiabetic drugs: Secondary | ICD-10-CM

## 2024-02-12 MED ORDER — LOSARTAN POTASSIUM-HCTZ 50-12.5 MG PO TABS: 1.0000 | ORAL_TABLET | Freq: Every day | ORAL | 4 refills | Status: AC

## 2024-02-12 MED ORDER — ESCITALOPRAM OXALATE 20 MG PO TABS: 20.0000 mg | ORAL_TABLET | Freq: Every day | ORAL | 4 refills | Status: AC

## 2024-02-12 MED ORDER — SEMAGLUTIDE (2 MG/DOSE) 8 MG/3ML ~~LOC~~ SOPN: 2.0000 mg | PEN_INJECTOR | SUBCUTANEOUS | 3 refills | Status: AC

## 2024-02-12 MED ORDER — VITAMIN D3 50 MCG (2000 UT) PO CAPS: 2000.0000 [IU] | ORAL_CAPSULE | Freq: Every day | ORAL | Status: AC

## 2024-02-12 MED ORDER — ROSUVASTATIN CALCIUM 10 MG PO TABS: 10.0000 mg | ORAL_TABLET | Freq: Every day | ORAL | 4 refills | Status: AC

## 2024-02-12 NOTE — Assessment & Plan Note (Signed)
 States weekly vit D was not covered by insurance Will start vit D3 2000 units daily.

## 2024-02-12 NOTE — Assessment & Plan Note (Signed)
 Chronic, great control on ozempic  - continue. Tolerating well without nausea, diarrhea, constipation, epigastric pain or throat symptoms Reviewed importance of prioritizing protein intake and regular strength training to prevent muscle loss on ozempic .

## 2024-02-12 NOTE — Assessment & Plan Note (Signed)
 Congratulated on weight loss to date.  Reviewed healthy diet and lifestyle changes to effect sustainable weight loss.

## 2024-02-12 NOTE — Assessment & Plan Note (Signed)
 -  Continue nightly CPAP

## 2024-02-12 NOTE — Assessment & Plan Note (Signed)
 Preventative protocols reviewed and updated unless pt declined. Discussed healthy diet and lifestyle.

## 2024-02-12 NOTE — Progress Notes (Signed)
 Ph: [phone] Fax: [phone]   Patient ID: Michael Mcconnell, male    DOB: 30-Apr-1971, 53 y.o.   MRN: [id number]  This visit was conducted in person.  BP 124/88 (BP Location: Right Arm, Cuff Size: Large)   Pulse 80   Temp 99.4 F (37.4 C) (Oral)   Ht 5' 9.5 (1.765 m)   Wt 223 lb (101.2 kg)   SpO2 93%   BMI 32.46 kg/m   BP Readings from Last 3 Encounters:  02/12/24 124/88  11/14/23 (!) 142/92  10/06/23 117/79   CC: CPE Subjective:   HPI: Michael Mcconnell is a 53 y.o. male presenting on 02/12/2024 for Annual Exam   Stressful period at work. Working 10-12 hours/day  HTN - BP elevated at home - 130-150s systolic. Attributes this to work stress.   DM - continues Ozempic  1mg  weekly, tolerating well with 12 lb weight loss.   Also continues Crestor  along with Lexapro  20mg  daily.   Preventative: COLONOSCOPY 02/2020 - 4 TAs, small hem, rpt 3 yrs Oletta)  Colonoscopy 02/2023 - int hem, rpt 5 yrs Oletta)  Prostate cancer screening - no fmhx. Nocturia x1. No weakening of stream.  Flu shot yearly  COVID vaccine - Pfizer 07/2019, 08/2019, booster 04/2020, bivalent 03/2021 Pneumovax 2017  Td 2009, Tdap 10/2017  Shingrix  - 12/2022, rpt today Seatbelt use discussed  Sunscreen use discussed. No changing moles on skin.  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 Interim medical history since our last visit reviewed. Allergies and medications reviewed and  updated. Outpatient Medications Prior to Visit  Medication Sig Dispense Refill   Blood Glucose Monitoring Suppl (ONE TOUCH ULTRA MINI) w/Device KIT Use as directed 1 each 0   glucose blood (ONE TOUCH ULTRA TEST) test strip Once daily and as needed 100 each 3   Multiple Vitamins-Minerals (MULTIVITAMIN PO) Take 1 capsule by mouth daily.     OneTouch Delica Lancets 30G MISC 1 EACH BY DOES NOT APPLY ROUTE AS DIRECTED. USE AS INSTRUCTED TO CHECK BLOOD SUGAR ONCE DAILY 100 each 11   psyllium (METAMUCIL) 0.52 g capsule Take 1 capsule (0.52 g total) by mouth in the morning and at bedtime.     sildenafil  (VIAGRA ) 50 MG tablet Take 1 tablet (50 mg total) daily as needed by mouth for erectile dysfunction. 10 tablet 1   Cholecalciferol (VITAMIN D3) 1.25 MG (50000 UT) CAPS Take 1 capsule (1.25 mg total) by mouth once a week. 12 capsule 0   escitalopram  (LEXAPRO ) 20 MG tablet Take 1 tablet (20 mg total) by mouth daily. 90 tablet 4   losartan -hydrochlorothiazide  (HYZAAR) 50-12.5 MG tablet Take 1 tablet by mouth daily. 90 tablet 4   rosuvastatin  (CRESTOR ) 10 MG tablet Take 1 tablet (10 mg total) by mouth daily. 90 tablet 4   Semaglutide , 2 MG/DOSE, 8 MG/3ML SOPN Inject 2 mg as directed once a week. 9 mL 0   triamcinolone  ointment (KENALOG ) 0.1 % Apply 1 Application topically 2 (two) times daily.  Apply to affected area(s) twice daily until itching has resolved. 80 g 0   No facility-administered medications prior to visit.     Per HPI unless specifically indicated in ROS section below Review of Systems  Constitutional:  Negative for activity change, appetite change, chills, fatigue, fever and unexpected weight change.  HENT:  Negative for hearing loss.   Eyes:  Negative for visual disturbance.  Respiratory:  Negative for cough, chest tightness, shortness of breath and wheezing.   Cardiovascular:  Negative for chest pain, palpitations and leg swelling.  Gastrointestinal:  Negative for abdominal distention,  abdominal pain, blood in stool, constipation, diarrhea, nausea and vomiting.  Genitourinary:  Negative for difficulty urinating and hematuria.  Musculoskeletal:  Negative for arthralgias, myalgias and neck pain.  Skin:  Negative for rash.  Neurological:  Negative for dizziness, seizures, syncope and headaches.  Hematological:  Negative for adenopathy. Does not bruise/bleed easily.  Psychiatric/Behavioral:  Positive for dysphoric mood. The patient is not nervous/anxious.     Objective:  BP 124/88 (BP Location: Right Arm, Cuff Size: Large)   Pulse 80   Temp 99.4 F (37.4 C) (Oral)   Ht 5' 9.5 (1.765 m)   Wt 223 lb (101.2 kg)   SpO2 93%   BMI 32.46 kg/m   Wt Readings from Last 3 Encounters:  02/12/24 223 lb (101.2 kg)  11/14/23 235 lb 2 oz (106.7 kg)  03/24/23 230 lb (104.3 kg)      Physical Exam Vitals and nursing note reviewed.  Constitutional:      General: He is not in acute distress.    Appearance: Normal appearance. He is well-developed. He is not ill-appearing.  HENT:     Head: Normocephalic and atraumatic.     Right Ear: Hearing, tympanic membrane, ear canal and external ear normal.     Left Ear: Hearing, tympanic membrane, ear canal and external ear normal.     Mouth/Throat:     Mouth: Mucous membranes are moist.     Pharynx: Oropharynx is clear. No oropharyngeal exudate or posterior oropharyngeal erythema.  Eyes:     General: No scleral icterus.    Extraocular Movements: Extraocular movements intact.     Conjunctiva/sclera: Conjunctivae normal.     Pupils: Pupils are equal, round, and reactive to light.  Neck:     Thyroid : No thyroid  mass or thyromegaly.  Cardiovascular:     Rate and Rhythm: Normal rate and regular rhythm.     Pulses: Normal pulses.          Radial pulses are 2+ on the right side and 2+ on the left side.     Heart sounds: Normal heart sounds. No murmur heard. Pulmonary:     Effort: Pulmonary effort is normal. No respiratory distress.      Breath sounds: Normal breath sounds. No wheezing, rhonchi or rales.  Abdominal:     General: Bowel sounds are normal. There is no distension.     Palpations: Abdomen is soft. There is no mass.     Tenderness: There is no abdominal tenderness. There is no guarding or rebound.     Hernia: No hernia is present.  Musculoskeletal:        General: Normal range of motion.     Cervical back: Normal range of motion and neck supple.     Right lower leg: No edema.     Left lower leg: No edema.  Lymphadenopathy:     Cervical: No cervical adenopathy.  Skin:  General: Skin is warm and dry.     Findings: No rash.  Neurological:     General: No focal deficit present.     Mental Status: He is alert and oriented to person, place, and time.  Psychiatric:        Mood and Affect: Mood normal.        Behavior: Behavior normal.        Thought Content: Thought content normal.        Judgment: Judgment normal.       Results for orders placed or performed in visit on 02/05/24  Vitamin B12   Collection Time: 02/05/24  7:59 AM  Result Value Ref Range   Vitamin B-12 233 211 - 911 pg/mL  PSA   Collection Time: 02/05/24  7:59 AM  Result Value Ref Range   PSA 1.14 0.10 - 4.00 ng/mL  CBC with Differential/Platelet   Collection Time: 02/05/24  7:59 AM  Result Value Ref Range   WBC 8.6 4.0 - 10.5 K/uL   RBC 6.24 (H) 4.22 - 5.81 Mil/uL   Hemoglobin 16.9 13.0 - 17.0 g/dL   HCT 48.5 60.9 - 47.9 %   MCV 82.4 78.0 - 100.0 fl   MCHC 32.9 30.0 - 36.0 g/dL   RDW 85.7 88.4 - 84.4 %   Platelets 160.0 150.0 - 400.0 K/uL   Neutrophils Relative % 68.0 43.0 - 77.0 %   Lymphocytes Relative 24.8 12.0 - 46.0 %   Monocytes Relative 6.0 3.0 - 12.0 %   Eosinophils Relative 0.7 0.0 - 5.0 %   Basophils Relative 0.5 0.0 - 3.0 %   Neutro Abs 5.8 1.4 - 7.7 K/uL   Lymphs Abs 2.1 0.7 - 4.0 K/uL   Monocytes Absolute 0.5 0.1 - 1.0 K/uL   Eosinophils Absolute 0.1 0.0 - 0.7 K/uL   Basophils Absolute 0.0 0.0 - 0.1 K/uL   Parathyroid  hormone, intact (no Ca)   Collection Time: 02/05/24  7:59 AM  Result Value Ref Range   PTH 14 (L) 16 - 77 pg/mL  VITAMIN D  25 Hydroxy (Vit-D Deficiency, Fractures)   Collection Time: 02/05/24  7:59 AM  Result Value Ref Range   VITD 61.52 30.00 - 100.00 ng/mL  Phosphorus   Collection Time: 02/05/24  7:59 AM  Result Value Ref Range   Phosphorus 2.8 2.3 - 4.6 mg/dL  Microalbumin / creatinine urine ratio   Collection Time: 02/05/24  7:59 AM  Result Value Ref Range   Microalb, Ur 5.7 (H) 0.0 - 1.9 mg/dL   Creatinine,U 645.8 mg/dL   Microalb Creat Ratio 16.1 0.0 - 30.0 mg/g  Hemoglobin A1c   Collection Time: 02/05/24  7:59 AM  Result Value Ref Range   Hgb A1c MFr Bld 6.7 (H) 4.6 - 6.5 %  Comprehensive metabolic panel with GFR   Collection Time: 02/05/24  7:59 AM  Result Value Ref Range   Sodium 142 135 - 145 mEq/L   Potassium 3.6 3.5 - 5.1 mEq/L   Chloride 103 96 - 112 mEq/L   CO2 32 19 - 32 mEq/L   Glucose, Bld 162 (H) 70 - 99 mg/dL   BUN 16 6 - 23 mg/dL   Creatinine, Ser 8.59 0.40 - 1.50 mg/dL   Total Bilirubin 1.6 (H) 0.2 - 1.2 mg/dL   Alkaline Phosphatase 73 39 - 117 U/L   AST 22 0 - 37 U/L   ALT 19 0 - 53 U/L   Total Protein 6.8 6.0 - 8.3 g/dL  Albumin 4.4 3.5 - 5.2 g/dL   GFR 41.70 (L) >39.99 mL/min   Calcium  9.0 8.4 - 10.5 mg/dL  Lipid panel   Collection Time: 02/05/24  7:59 AM  Result Value Ref Range   Cholesterol 134 0 - 200 mg/dL   Triglycerides 830.9 (H) 0.0 - 149.0 mg/dL   HDL 59.39 >60.99 mg/dL   VLDL 66.1 0.0 - 59.9 mg/dL   LDL Cholesterol 59 0 - 99 mg/dL   Total CHOL/HDL Ratio 3    NonHDL 92.93       02/12/2024    9:39 AM 11/14/2023    7:56 AM 01/31/2023    8:40 AM 01/10/2023   12:04 PM 07/27/2022    8:54 AM  Depression screen PHQ 2/9  Decreased Interest 2 1 2 1 1   Down, Depressed, Hopeless 1 0 2 0 1  PHQ - 2 Score 3 1 4 1 2   Altered sleeping 2 1 1 1 1   Tired, decreased energy 2 1 2 1 1   Change in appetite 2 1 1 2 1   Feeling bad or  failure about yourself  1 0 1 0 1  Trouble concentrating 2 1 2 1 1   Moving slowly or fidgety/restless 1 0 1 0 0  Suicidal thoughts 1 1 1  0 1  PHQ-9 Score 14 6 13 6 8   Difficult doing work/chores Very difficult Somewhat difficult Very difficult Somewhat difficult Not difficult at all       02/12/2024    9:40 AM 11/14/2023    7:56 AM 01/31/2023    8:41 AM 01/10/2023   12:04 PM  GAD 7 : Generalized Anxiety Score  Nervous, Anxious, on Edge 2 1 2 1   Control/stop worrying 2 2 2 1   Worry too much - different things 2 2 2 1   Trouble relaxing 2 2 2 1   Restless 1 1 2 1   Easily annoyed or irritable 2 2 3 1   Afraid - awful might happen 2 1 2 1   Total GAD 7 Score 13 11 15 7   Anxiety Difficulty Very difficult   Not difficult at all   Assessment & Plan:   Problem List Items Addressed This Visit     Health maintenance examination - Primary (Chronic)   Preventative protocols reviewed and updated unless pt declined. Discussed healthy diet and lifestyle.       Type 2 diabetes mellitus with other specified complication (HCC)   Chronic, great control on ozempic  - continue. Tolerating well without nausea, diarrhea, constipation, epigastric pain or throat symptoms Reviewed importance of prioritizing protein intake and regular strength training to prevent muscle loss on ozempic .       Relevant Medications   losartan -hydrochlorothiazide  (HYZAAR) 50-12.5 MG tablet   rosuvastatin  (CRESTOR ) 10 MG tablet   Semaglutide , 2 MG/DOSE, 8 MG/3ML SOPN   Hypertension   Chronic, stable. Continue current regimen.       Relevant Medications   losartan -hydrochlorothiazide  (HYZAAR) 50-12.5 MG tablet   rosuvastatin  (CRESTOR ) 10 MG tablet   Dyslipidemia associated with type 2 diabetes mellitus (HCC)   Chronic, stable on crestor - continue. The ASCVD Risk score (Arnett DK, et al., 2019) failed to calculate for the following reasons:   Unable to determine if patient is Non-Hispanic African American        Relevant Medications   losartan -hydrochlorothiazide  (HYZAAR) 50-12.5 MG tablet   rosuvastatin  (CRESTOR ) 10 MG tablet   Semaglutide , 2 MG/DOSE, 8 MG/3ML SOPN   Obesity, Class I, BMI 30-34.9   Congratulated on weight loss to  date.  Reviewed healthy diet and lifestyle changes to effect sustainable weight loss.        NAFLD (nonalcoholic fatty liver disease)   LFTs remain stable. Anticipate Gilbert's syndrome      OSA (obstructive sleep apnea)   Continue nightly CPAP      Adjustment disorder with mixed anxiety and depressed mood   Chronic deteriorated in setting of increased work stresses.  Support provided. Contracts for safety. Encouraged regular exercise for stress relieving effect. Continue Lexapro  20mg  daily.  Discussed possible need to switch jobs given significant stressors present.       CKD stage 3 due to type 2 diabetes mellitus (HCC)   Chronic, stable. Reviewed with patient.       Relevant Medications   losartan -hydrochlorothiazide  (HYZAAR) 50-12.5 MG tablet   rosuvastatin  (CRESTOR ) 10 MG tablet   Semaglutide , 2 MG/DOSE, 8 MG/3ML SOPN   Vitamin D  deficiency   States weekly vit D was not covered by insurance Will start vit D3 2000 units daily.       Other Visit Diagnoses       Long-term (current) use of injectable non-insulin antidiabetic drugs         Encounter for immunization       Relevant Orders   Flu vaccine trivalent PF, 6mos and older(Flulaval,Afluria,Fluarix,Fluzone) (Completed)     Need for shingles vaccine       Relevant Orders   Varicella-zoster vaccine IM (Completed)        Meds ordered this encounter  Medications   Cholecalciferol (VITAMIN D3) 50 MCG (2000 UT) capsule    Sig: Take 1 capsule (2,000 Units total) by mouth daily.   escitalopram  (LEXAPRO ) 20 MG tablet    Sig: Take 1 tablet (20 mg total) by mouth daily.    Dispense:  90 tablet    Refill:  4   losartan -hydrochlorothiazide  (HYZAAR) 50-12.5 MG tablet    Sig: Take 1 tablet by  mouth daily.    Dispense:  90 tablet    Refill:  4   rosuvastatin  (CRESTOR ) 10 MG tablet    Sig: Take 1 tablet (10 mg total) by mouth daily.    Dispense:  90 tablet    Refill:  4   Semaglutide , 2 MG/DOSE, 8 MG/3ML SOPN    Sig: Inject 2 mg as directed once a week.    Dispense:  9 mL    Refill:  3    Orders Placed This Encounter  Procedures   Flu vaccine trivalent PF, 6mos and older(Flulaval,Afluria,Fluarix,Fluzone)   Varicella-zoster vaccine IM    Patient Instructions  Flu shot and 2nd and final shingrix  shot today  Incorporate strength training again.  Start vitamin D3 2000 units daily.  Good to see you today Return in 6 months for diabetes follow up, sooner if needed.    Follow up plan: Return in about 6 months (around 08/11/2024) for follow up visit.  Anton Blas, MD

## 2024-02-12 NOTE — Assessment & Plan Note (Signed)
Chronic, stable. Reviewed with patient.  

## 2024-02-12 NOTE — Patient Instructions (Addendum)
 Flu shot and 2nd and final shingrix  shot today  Incorporate strength training again.  Start vitamin D3 2000 units daily.  Good to see you today Return in 6 months for diabetes follow up, sooner if needed.

## 2024-02-12 NOTE — Assessment & Plan Note (Signed)
 Chronic deteriorated in setting of increased work stresses.  Support provided. Contracts for safety. Encouraged regular exercise for stress relieving effect. Continue Lexapro  20mg  daily.  Discussed possible need to switch jobs given significant stressors present.

## 2024-02-12 NOTE — Assessment & Plan Note (Signed)
 Chronic, stable on crestor - continue. The ASCVD Risk score (Arnett DK, et al., 2019) failed to calculate for the following reasons:   Unable to determine if patient is Non-Hispanic African American

## 2024-02-12 NOTE — Assessment & Plan Note (Signed)
 Chronic, stable. Continue current regimen.

## 2024-02-12 NOTE — Assessment & Plan Note (Signed)
 LFTs remain stable. Anticipate Gilbert's syndrome

## 2024-03-28 NOTE — Progress Notes (Signed)
 EDKER PUNT                                          MRN: [id number]   03/28/2024   The [facility] Quality Team Specialist reviewed this patient medical record for the purposes of chart review for care gap closure. The following were reviewed: abstraction for care gap closure-kidney health evaluation for diabetes:eGFR  and uACR.    [facility] Quality Team

## 2024-05-01 ENCOUNTER — Encounter: Payer: Self-pay | Admitting: Family Medicine

## 2024-05-02 ENCOUNTER — Other Ambulatory Visit: Payer: Self-pay

## 2024-05-02 DIAGNOSIS — E1169 Type 2 diabetes mellitus with other specified complication: Secondary | ICD-10-CM

## 2024-05-02 MED ORDER — GLUCOSE BLOOD VI STRP: ORAL_STRIP | 3 refills | Status: AC

## 2024-08-12 ENCOUNTER — Ambulatory Visit: Admitting: Family Medicine
